# Patient Record
Sex: Female | Born: 1970 | Race: Asian | Hispanic: No | Smoking: Never smoker
Health system: Southern US, Community
[De-identification: ages and names within clinical notes are randomized; demographics above are authoritative.]

## PROBLEM LIST (undated history)

## (undated) DIAGNOSIS — D219 Benign neoplasm of connective and other soft tissue, unspecified: Secondary | ICD-10-CM

## (undated) DIAGNOSIS — E611 Iron deficiency: Secondary | ICD-10-CM

## (undated) DIAGNOSIS — R51 Headache: Secondary | ICD-10-CM

## (undated) DIAGNOSIS — E079 Disorder of thyroid, unspecified: Secondary | ICD-10-CM

## (undated) DIAGNOSIS — K8020 Calculus of gallbladder without cholecystitis without obstruction: Secondary | ICD-10-CM

## (undated) DIAGNOSIS — K219 Gastro-esophageal reflux disease without esophagitis: Secondary | ICD-10-CM

## (undated) DIAGNOSIS — N12 Tubulo-interstitial nephritis, not specified as acute or chronic: Secondary | ICD-10-CM

## (undated) HISTORY — DX: Calculus of gallbladder without cholecystitis without obstruction: K80.20

## (undated) HISTORY — DX: Disorder of thyroid, unspecified: E07.9

---

## 1998-10-27 ENCOUNTER — Ambulatory Visit (HOSPITAL_COMMUNITY): Admission: RE | Admit: 1998-10-27 | Discharge: 1998-10-27 | Payer: Self-pay | Admitting: Gynecology

## 1998-10-27 ENCOUNTER — Encounter (INDEPENDENT_AMBULATORY_CARE_PROVIDER_SITE_OTHER): Payer: Self-pay | Admitting: Specialist

## 1999-02-06 ENCOUNTER — Other Ambulatory Visit: Admission: RE | Admit: 1999-02-06 | Discharge: 1999-02-06 | Payer: Self-pay | Admitting: Obstetrics and Gynecology

## 1999-06-03 ENCOUNTER — Inpatient Hospital Stay (HOSPITAL_COMMUNITY): Admission: AD | Admit: 1999-06-03 | Discharge: 1999-06-03 | Payer: Self-pay | Admitting: Gynecology

## 1999-06-29 ENCOUNTER — Encounter: Admission: RE | Admit: 1999-06-29 | Discharge: 1999-09-27 | Payer: Self-pay | Admitting: Gynecology

## 1999-08-06 ENCOUNTER — Inpatient Hospital Stay (HOSPITAL_COMMUNITY): Admission: AD | Admit: 1999-08-06 | Discharge: 1999-08-10 | Payer: Self-pay | Admitting: Gynecology

## 1999-08-06 ENCOUNTER — Encounter (INDEPENDENT_AMBULATORY_CARE_PROVIDER_SITE_OTHER): Payer: Self-pay

## 1999-08-12 ENCOUNTER — Encounter (HOSPITAL_COMMUNITY): Admission: RE | Admit: 1999-08-12 | Discharge: 1999-11-10 | Payer: Self-pay | Admitting: Gynecology

## 1999-09-21 ENCOUNTER — Other Ambulatory Visit: Admission: RE | Admit: 1999-09-21 | Discharge: 1999-09-21 | Payer: Self-pay | Admitting: Gynecology

## 1999-10-04 ENCOUNTER — Encounter: Admission: RE | Admit: 1999-10-04 | Discharge: 1999-10-04 | Payer: Self-pay | Admitting: Hematology and Oncology

## 1999-11-12 ENCOUNTER — Encounter: Admission: RE | Admit: 1999-11-12 | Discharge: 1999-12-06 | Payer: Self-pay | Admitting: Gynecology

## 2001-02-23 ENCOUNTER — Other Ambulatory Visit: Admission: RE | Admit: 2001-02-23 | Discharge: 2001-02-23 | Payer: Self-pay | Admitting: Gynecology

## 2001-08-25 ENCOUNTER — Emergency Department (HOSPITAL_COMMUNITY): Admission: EM | Admit: 2001-08-25 | Discharge: 2001-08-25 | Payer: Self-pay | Admitting: Emergency Medicine

## 2002-07-19 ENCOUNTER — Other Ambulatory Visit: Admission: RE | Admit: 2002-07-19 | Discharge: 2002-07-19 | Payer: Self-pay | Admitting: Obstetrics and Gynecology

## 2003-07-18 ENCOUNTER — Ambulatory Visit (HOSPITAL_COMMUNITY): Admission: RE | Admit: 2003-07-18 | Discharge: 2003-07-18 | Payer: Self-pay | Admitting: Cardiology

## 2004-04-09 ENCOUNTER — Ambulatory Visit: Payer: Self-pay | Admitting: Family Medicine

## 2004-04-09 ENCOUNTER — Other Ambulatory Visit: Admission: RE | Admit: 2004-04-09 | Discharge: 2004-04-09 | Payer: Self-pay | Admitting: Family Medicine

## 2004-04-10 ENCOUNTER — Other Ambulatory Visit: Admission: RE | Admit: 2004-04-10 | Discharge: 2004-04-10 | Payer: Self-pay | Admitting: Family Medicine

## 2004-04-16 ENCOUNTER — Encounter: Admission: RE | Admit: 2004-04-16 | Discharge: 2004-04-16 | Payer: Self-pay | Admitting: Family Medicine

## 2004-04-18 ENCOUNTER — Ambulatory Visit: Payer: Self-pay | Admitting: Family Medicine

## 2004-07-23 ENCOUNTER — Ambulatory Visit: Payer: Self-pay | Admitting: Family Medicine

## 2004-07-30 ENCOUNTER — Ambulatory Visit: Payer: Self-pay

## 2005-04-30 ENCOUNTER — Other Ambulatory Visit: Admission: RE | Admit: 2005-04-30 | Discharge: 2005-04-30 | Payer: Self-pay | Admitting: Internal Medicine

## 2005-04-30 ENCOUNTER — Ambulatory Visit: Payer: Self-pay | Admitting: Internal Medicine

## 2005-04-30 ENCOUNTER — Encounter: Payer: Self-pay | Admitting: Internal Medicine

## 2005-05-13 ENCOUNTER — Encounter: Admission: RE | Admit: 2005-05-13 | Discharge: 2005-05-13 | Payer: Self-pay | Admitting: Internal Medicine

## 2005-05-27 ENCOUNTER — Encounter: Admission: RE | Admit: 2005-05-27 | Discharge: 2005-05-27 | Payer: Self-pay | Admitting: Internal Medicine

## 2005-07-01 ENCOUNTER — Encounter: Admission: RE | Admit: 2005-07-01 | Discharge: 2005-07-01 | Payer: Self-pay | Admitting: Internal Medicine

## 2006-01-27 ENCOUNTER — Ambulatory Visit: Payer: Self-pay | Admitting: Internal Medicine

## 2006-01-27 ENCOUNTER — Encounter: Admission: RE | Admit: 2006-01-27 | Discharge: 2006-01-27 | Payer: Self-pay | Admitting: Internal Medicine

## 2006-01-27 LAB — CONVERTED CEMR LAB
Ferritin: 7.8 ng/mL — ABNORMAL LOW (ref 10.0–291.0)
HDL: 41.3 mg/dL (ref >39.0)
Iron: 52 ug/dL (ref 42–145)
VLDL: 20 mg/dL (ref 0–40)

## 2006-08-21 ENCOUNTER — Encounter: Payer: Self-pay | Admitting: Internal Medicine

## 2006-11-13 ENCOUNTER — Telehealth (INDEPENDENT_AMBULATORY_CARE_PROVIDER_SITE_OTHER): Payer: Self-pay | Admitting: *Deleted

## 2006-12-08 ENCOUNTER — Ambulatory Visit: Payer: Self-pay | Admitting: Internal Medicine

## 2006-12-08 DIAGNOSIS — O99810 Abnormal glucose complicating pregnancy: Secondary | ICD-10-CM | POA: Insufficient documentation

## 2006-12-08 DIAGNOSIS — M542 Cervicalgia: Secondary | ICD-10-CM

## 2006-12-08 DIAGNOSIS — E039 Hypothyroidism, unspecified: Secondary | ICD-10-CM | POA: Insufficient documentation

## 2006-12-15 ENCOUNTER — Ambulatory Visit: Payer: Self-pay | Admitting: Internal Medicine

## 2006-12-23 ENCOUNTER — Ambulatory Visit: Payer: Self-pay | Admitting: Internal Medicine

## 2006-12-23 DIAGNOSIS — R079 Chest pain, unspecified: Secondary | ICD-10-CM

## 2006-12-26 ENCOUNTER — Ambulatory Visit: Payer: Self-pay | Admitting: Cardiovascular Disease

## 2007-01-05 ENCOUNTER — Encounter: Payer: Self-pay | Admitting: Internal Medicine

## 2007-01-12 ENCOUNTER — Encounter: Payer: Self-pay | Admitting: Cardiovascular Disease

## 2007-01-12 ENCOUNTER — Encounter: Payer: Self-pay | Admitting: Internal Medicine

## 2007-01-12 ENCOUNTER — Ambulatory Visit: Payer: Self-pay

## 2007-07-10 ENCOUNTER — Telehealth: Payer: Self-pay | Admitting: Internal Medicine

## 2007-07-13 ENCOUNTER — Ambulatory Visit: Payer: Self-pay | Admitting: Internal Medicine

## 2007-07-13 ENCOUNTER — Encounter: Payer: Self-pay | Admitting: Internal Medicine

## 2007-07-13 DIAGNOSIS — J301 Allergic rhinitis due to pollen: Secondary | ICD-10-CM | POA: Insufficient documentation

## 2007-07-14 ENCOUNTER — Telehealth: Payer: Self-pay | Admitting: Internal Medicine

## 2007-07-21 LAB — CONVERTED CEMR LAB
ALT: 16 units/L (ref 0–35)
AST: 14 units/L (ref 0–37)
Bilirubin, Direct: 0.1 mg/dL (ref 0.0–0.3)
Total Bilirubin: 0.6 mg/dL (ref 0.3–1.2)

## 2009-01-30 ENCOUNTER — Encounter: Payer: Self-pay | Admitting: Internal Medicine

## 2010-04-08 LAB — CONVERTED CEMR LAB
Eosinophils Absolute: 0.1 10*3/uL (ref 0.0–0.6)
Eosinophils Relative: 1.7 % (ref 0.0–5.0)
GFR calc Af Amer: 180 mL/min
GFR calc non Af Amer: 148 mL/min
Glucose, Bld: 104 mg/dL — ABNORMAL HIGH (ref 70–99)
HCT: 34.1 % — ABNORMAL LOW (ref 36.0–46.0)
HDL: 40 mg/dL (ref >39.0)
Hemoglobin: 11.7 g/dL — ABNORMAL LOW (ref 12.0–15.0)
Lymphocytes Relative: 23.3 % (ref 12.0–46.0)
MCV: 85 fL (ref 78.0–100.0)
Monocytes Absolute: 0.4 10*3/uL (ref 0.2–0.7)
Neutro Abs: 4.6 10*3/uL (ref 1.4–7.7)
Neutrophils Relative %: 68.3 % (ref 43.0–77.0)
Platelets: 238 10*3/uL (ref 150–400)
Potassium: 4.4 meq/L (ref 3.5–5.1)
Sodium: 137 meq/L (ref 135–145)
WBC: 6.7 10*3/uL (ref 4.5–10.5)

## 2010-07-24 NOTE — Assessment & Plan Note (Signed)
[facility]                            CARDIOLOGY OFFICE NOTE   KAMICA, FLORANCE                   MRN:          [id number]  DATE:12/26/2006                            DOB:          1971-01-09    Ms. Brummond is a delightful 40 year old patient referred by Dr. Drue Novel for  chest pain.   The patient is extremely concerned because of her family history. She  has siblings and a mother and father who all had premature coronary  disease, as well as strokes. She has hyperlipidemia with an LDL  cholesterol of 136. She is a non-smoker, non-diabetic, and is non-  hypertensive. She has been having chest pain that has been going on for  one to two weeks. It is somewhat atypical. It is non-exertional. It is a  pinching feeling in her chest. It can be sharp at times. It does radiate  up towards her neck. There is occasionally associated dyspnea.   Sometimes the problem is made worse at night when she lays down. She  does not have a firm diagnosis of reflux or GERD.   She has had pains in the past. She describes on two previous occasions  having a workup. One of them was by a [place] doctor here in town. It  was not Dr. Jacinto Halim. I do not have these records. She said that she had a  stress test and it looked fine.   There were no precipitating factors that she can tell in regards to the  new onset of pain in the last couple of weeks. The pain is not  progressive, however it is not going away. She does not have the pain  currently.   When she gets the pain, there is nothing that she can do to make it  better or go away quicker. She works as a Tree surgeon and does not find  that her activities during her job particularly bring it on.   She has no history of arthritides or musculoskeletal problems. She has  not had any recent trauma, fever, or inflammatory illnesses.   Her review of systems is otherwise remarkable for some exertional  dyspnea. This seems  function. There is no chronic obstructive pulmonary  disease or lung disease. She has not had a cough. There is no history of  DVT or PE.   The patient's past medical history is fairly benign. She has had two C-  sections for her children. Her LDL cholesterol is 136. She has not had  any other surgeries. She is hypothyroid on replacement. She did get a  flu vaccine this year. Her past medical history is otherwise negative.   Family history is remarkable for premature coronary disease in mother  and father, as well as siblings. She has a sister who had a heart  problem at the age of 36.   The patient's medications include:  1. Synthroid 175 mcg daily.  2. Bayer aspirin.  3. Fish oil.   The patient denies any allergies.   She is married. She is from [place]. Her husband works at [employer]. 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 She has two children of  ages 65 and 45 that are healthy. She has limited hobbies and is  sedentary. She does not smoke or drink.   The remainder of her extended family is in [place].   Her exam is remarkable for:  GENERAL:  Healthy appearing [place] female in no distress. Affect is  appropriate. She has a ring in her left nostril.  VITAL SIGNS:  Blood pressure 108/72, pulse 72 and regular, weight 166.  She is afebrile. Respiratory rate 14.  HEENT:  Normal carotids. Normal without bruit. There is no thyromegaly.  No lymphadenopathy. No JVP elevation.  LUNGS:  Clear with good diaphragmatic motion and no wheezing.  CARDIAC:  S1, S2 with normal heart sounds. PMI is normal.  BREASTS:  Not done.  ABDOMEN:  Benign. Bowel sounds positive. No tenderness. No AAA. No  hepatosplenomegaly or hepato jugular reflux.  EXTREMITIES:  Femorals are +3 bilaterally without bruit. Posterior  tibialis pulses are +3. No lower extremity edema.  NEUROLOGIC:  Non-focal.  MUSCULAR:  Normal.  SKIN:  Warm and dry.   Baseline EKG normal except for low voltage across the  precordium, likely  secondary to lead placement and breast tissue.   As indicated, lab work was reviewed with an LDL of 136.   IMPRESSION:  1. Atypical chest pain in a young woman with a markedly positive      family history. Recommend stress Myoview to be done next week.  2. Occasional exertional dyspnea, likely secondary to deconditioning.      Check 2D echocardiogram to rule out cardiomyopathy or primary      pulmonary hypertension.  3. LDL cholesterol of 136. Given the patient's age, I suspect that      diet therapy and a nutrition consult will be the best thing for      her, as opposed to life-long statin therapy. Will leave this up to      Dr. Drue Novel to discuss.  4. As long as her stress test and echo are normal, she will be seen on      an as-needed basis.    Noralyn Pick. Eden Emms, MD, [facility]  Electronically Signed   PCN/MedQ  DD: 12/26/2006  DT: 12/27/2006  Job #: [id number]

## 2010-07-27 NOTE — Discharge Summary (Signed)
[facility]  Patient:    MARYETTA, SHAFER                     MRN: [id number] Adm. Date:  [id number] Disc. Date: [id number] Attending:  Douglass Rivers Dictator:   Antony Contras, [facility]                           Discharge Summary  DISCHARGE DIAGNOSES:          1. Intrauterine pregnancy at 40 weeks with                                  breech presentation.                               2. History of gestational diabetes.                               3. History of noncompliance.  PROCEDURE:                    Low transverse cesarean section.  HISTORY OF PRESENT ILLNESS:   The patient is a 40 year old gravida 4, para 1-0-2-1-1 with an LMP of November 10, 1998 and an [facility] of Aug 07, 1999 by ultrasound.  The pregnancy was complicated by hypothyroidism, for which she takes Synthroid, 0.75 mg, also gestational diabetes and the patient was noncompliant concerning the management of the gestational diabetes and not presenting routinely for office visits or following her diet or checking her sugars.  Prenatal labs were as follow.  Blood type A positive, rubella immune, RPR/HBSAG/HIV nonreactive, MSAFP within normal limits.  HOSPITAL COURSE AND TREATMENT:                The patient was admitted for glycemic control and elective cesarean section due to her breech presentation and, also, in hopes to promote better glycemic control in her newborn.  Low cervical transverse cesarean section was performed by Dr. Farrel Gobble, assisted by Dr. Lily Peer under spinal anesthesia.  She delivered a viable female infant with APGAR of 9 and 9, birth weight 3695 g.  Normal uterus, tubes and ovaries.  Her postpartum course was uneventful.  She had no difficulty voiding and no fever. Her FBS was 92 on the day after delivery.  CBC showed hematocrit 27.2, hemoglobin 9.2, white blood cell count 7.7, platelets 148.  She was able to be discharged on her third postoperative day in  satisfactory condition.  DISPOSITION:                  Follow up for six week check.  MEDICATIONS:                  Tylox, 1-2 p.o. q.4-6h. p.r.n. pain.  She was instructed to check her fasting blood sugars and to bring a record of this to her postpartum visit. DD:  08/27/99 TD:  08/28/99 Job: [id number]

## 2010-07-27 NOTE — H&P (Signed)
[facility]  Patient:    Whitney Chambers, Whitney Chambers                     MRN: [id number] Adm. Date:  [id number] Attending:  Douglass Rivers                         History and Physical  CHIEF COMPLAINT:              1. Gestational diabetic, type unknown.                               2. Noncompliance.                               3. Intrauterine pregnancy at 40 weeks breech.  HISTORY OF PRESENT ILLNESS:   The patient is a 40 year old gravida 4, para 1-0-2-1, with an LMP of November 10, 1998, estimated date of confinement of Aug 07, 1999.  This pregnancy was complicated by hypothyroidism on Synthroid and gestational diabetes, as well as noncompliance.  The patient was noted to have an elevated one-hour Glucola of 158 at approximately 26 weeks and was scheduled for a three-hour GTT.  The patient failed to keep that appointment on several visits to the office and also failed to keep return OB visits.  She finally had her three-hour Glucola at close to 34 weeks which made her gestational diabetic.  She was then lost to followup for four weeks. When she presented back to the office at 38+ weeks, she had not been doing finger sticks, nor had she been following the diabetic diet.  It was stressed to her the importance of compliance with regard to fetal size, fetal postpartum glycemic control, as well as electrolyte balance.  The patient was asked to come back to the office over the weekend.  When she presented back three days later, she had only done one or two finger sticks, both of which were elevated.  Again it was stressed the importance that she comply with the diet.  Diet was discussed with her.  The patient appeared to understand and followed back up several days later with finger sticks four x a day, fastings, and two hour postprandials.  Despite what she felt was compliant with the diet, all of her finger sticks were elevated.  We tried again to instill  the diabetic diet.  She was unable to meet the nutritionist because of other obligations and again presented back to the office at 39+ weeks, again with all of her finger sticks erratically elevated.  She had gotten NST and AFIs because of the diabetes which is how we discovered that she was a frank breech presentation.  Initially AFI had been low, but on the last scan had been normal.  The patient therefore presented electively today for improved glycemic control.  PRENATAL LABORATORY DATA:     She is A positive, antibody negative, RPR nonreactive, rubella immune.  Hepatitis B surface antigen nonreactive.  HIV nonreactive.  GBS negative.  PAST OB/GYN HISTORY:          Regular menses, 4-7 day flow.  Normal Pap smears.  An NSVD at full term in 1997; in 1999, a six-week therapeutic abortion.  In September 2000, [facility] for a missed AB for a blighted ovum.  PAST MEDICAL HISTORY:  Significant only for hypothyroidism which was diagnosed in her last pregnancy.  PAST SURGICAL HISTORY:        Dilatation and curettage in 1999 and 2000.  MEDICATIONS:                  1. Synthroid 0.05.                               2. Prenatal vitamins.                               3. Iron.  ALLERGIES:                    Negative.  FAMILY HISTORY:               Negative.  PHYSICAL EXAMINATION  VITAL SIGNS:                  Temperature 97.1, blood pressure 98/54, heart rate 88, respiratory rate 20.  GENERAL:                      No acute distress.  HEENT:                        Unremarkable.  NECK:                         Thyroid has been tender and mobile.  CARDIAC:                      Heart is regular rate.  LUNGS:                        Clear to auscultation.  ABDOMEN:                      Gravid, soft, nontender.  PELVIC:                       Bimanual exam was deferred.  EXTREMITIES:                  No edema.  Normal reflex.  No clonus.                                NST is  reactive.  No contractions.  ASSESSMENT:                   Forty week gestational diabetic, noncompliance with appointments and diet.  The patient was admitted this afternoon and is planned for a primary cesarean section in the morning for glycemic control in order to improve the neonatal period of her newborn.  She is aware that noncompliance with a diabetic diet can increase risks for hypoglycemic episodes in the newborn as well as electrolyte abnormalities.  Since being admitted to the hospital and following the diabetic diet, post lunch CBG was 128, her post dinner CBG was 100.  She will get an ultrasound in the morning and if the baby is confirmed still breech, we will proceed with a primary cesarean section.  The risks and benefits of cesarean section were discussed with the patient and accepted.  All questions were addressed  and she will be NPO after midnight. DD:  08/06/99 TD:  08/06/99 Job: [id number]

## 2010-07-27 NOTE — Op Note (Signed)
[facility]  Patient:    Whitney Chambers, Whitney Chambers                     MRN: [id number] Proc. Date: 08/07/99 Adm. Date:  [id number] Attending:  Douglass Rivers                           Operative Report  PREOPERATIVE DIAGNOSIS:       Homero Fellers breech presentation, gestational diabetes, 40-week intrauterine pregnancy.  POSTOPERATIVE DIAGNOSIS:      Homero Fellers breech presentation, gestational diabetes, 40-week intrauterine pregnancy.  OPERATION:                    Primary cesarean section low flap transverse.  SURGEON:                      Douglass Rivers, M.D.  ASSISTANT:                    Gaetano Hawthorne. Lily Peer, M.D.  ANESTHESIA:                   Spinal.  ESTIMATED BLOOD LOSS:         500 cc.  FINDINGS:                     A viable female infant in the sacrum posterior position, frank breech.  Clear amniotic fluid, Apgars 9 and 9, birth weight 3695 grams.  Normal uterus, tubes, and ovaries.  COMPLICATIONS:                None.  PATHOLOGY:                    Placenta.  DESCRIPTION OF PROCEDURE:     The patient was taken to the operating room and spinal anesthesia was induced and placed in the supine position with left lateral displacement, prepped and draped in the usual sterile fashion.  After adequate anesthesia was inserted, Pfannenstiel skin incision was made with the scalpel and carried through to the underlying layer of the fascia with electrocautery.  The  fascia was scored in the midline and the incision was extended laterally with Mayo scissors.  The inferior aspect of the fascial incision was grasped with Kochers. The underlying rectus muscles were dissected off by blunt and sharp dissection. In a similar fashion, the superior aspect of the incision was grasped with Kochers. The underlying rectus muscles were separated off.  The rectus muscles were separated.  The peritoneum was identified, tented up, and entered by blunt dissection.  The  peritoneal incision was then extended superiorly and inferiorly with good visualization of the underlying bowel and bladder.  The orientation of the uterus was confirmed.  The bladder blade was inserted.  The vesicouterine peritoneum was identified, tented up, entered sharply with the Metzenbaum scissors. The incision was extended laterally.  The bladder flap was created digitally. he bladder blade was then reinserted and the lower uterine segment was incised in  transverse fashion with the scalpel.  Clear amniotic fluid was noted upon entering the cavity.  The infant was delivered from the frank breech presentation with the usual maneuvers.  A loose nuchal cord was noted.  The cord was cut and clamped nd the infant was handed off to the awaiting pediatricians.  Cord bloods were obtained. The uterus was massaged.  The placenta was allowed to separate naturally.  The uterus was then cleared of all clots and debris.  The uterine incision was repaired with a running layer of 0 chromic.  A small area of bleeding inferior o the incision was treated with a single figure-of-eight suture.  The pelvis was irrigated with copious amounts of warm saline.  The incision was reinspected and hemostatic as were the peritoneum and muscle.  The fascia was then closed with  Vicryl starting at the apex and moving toward the midline bilaterally.  The subcu was irrigated and the skin was closed with staples.  The patient tolerated the procedure well.  Sponge, needle, and instrument counts were correct x 2.  She was given Ancef intraoperatively and transferred to the PACU in stable condition. DD:  08/07/99 TD:  08/07/99 Job: [id number]

## 2011-01-08 ENCOUNTER — Other Ambulatory Visit: Payer: Self-pay | Admitting: Family Medicine

## 2011-01-08 DIAGNOSIS — IMO0002 Reserved for concepts with insufficient information to code with codable children: Secondary | ICD-10-CM

## 2011-01-08 DIAGNOSIS — R1013 Epigastric pain: Secondary | ICD-10-CM

## 2011-01-14 ENCOUNTER — Ambulatory Visit
Admission: RE | Admit: 2011-01-14 | Discharge: 2011-01-14 | Disposition: A | Discharge status: Home / Self Care | Payer: BC Managed Care – PPO | Source: Ambulatory Visit | Attending: Family Medicine | Admitting: Family Medicine

## 2011-01-14 DIAGNOSIS — IMO0002 Reserved for concepts with insufficient information to code with codable children: Secondary | ICD-10-CM

## 2011-01-14 DIAGNOSIS — R1013 Epigastric pain: Secondary | ICD-10-CM

## 2011-01-29 ENCOUNTER — Ambulatory Visit (INDEPENDENT_AMBULATORY_CARE_PROVIDER_SITE_OTHER): Payer: BC Managed Care – PPO | Admitting: Surgery

## 2011-02-11 ENCOUNTER — Encounter (INDEPENDENT_AMBULATORY_CARE_PROVIDER_SITE_OTHER): Payer: Self-pay | Admitting: Surgery

## 2011-02-11 ENCOUNTER — Ambulatory Visit (INDEPENDENT_AMBULATORY_CARE_PROVIDER_SITE_OTHER): Payer: BC Managed Care – PPO | Admitting: Surgery

## 2011-02-11 DIAGNOSIS — K8020 Calculus of gallbladder without cholecystitis without obstruction: Secondary | ICD-10-CM

## 2011-02-11 NOTE — Progress Notes (Signed)
Patient ID: Whitney Chambers, female   DOB: 19-May-1970, 40 y.o.   MRN: [id number]  Chief Complaint  Patient presents with  . Other    new pt- eval GB with stones    HPI Whitney Chambers is a 40 y.o. female.    HPI Patient presents at the request of Dr. Drue Novel do 2 epigastric abdominal pain. She has had one attack of mild epigastric abdominal pain. It lasted 2 hours. It went away on its. He was not related to any meal.  No associated nausea or vomiting. No fatty food intolerance. Ultrasound was ordered of her abdomen showed gallstones without signs of inflammation or common duct stone.  Past Medical History  Diagnosis Date  . Gallstones   . Thyroid disease     hypothyroidism    History reviewed. No pertinent past surgical history.  History reviewed. No pertinent family history.  Social History History  Substance Use Topics  . Smoking status: Never Smoker   . Smokeless tobacco: Not on file  . Alcohol Use: Yes    No Known Allergies  Current Outpatient Prescriptions  Medication Sig Dispense Refill  . aspirin 81 MG tablet Take 81 mg by mouth daily.        . fish oil-omega-3 fatty acids 1000 MG capsule Take 2 g by mouth daily.        Marland Kitchen levothyroxine (SYNTHROID, LEVOTHROID) 150 MCG tablet Take 150 mcg by mouth daily.        Marland Kitchen VITAMIN E PO Take by mouth daily.          Review of Systems Review of Systems  Constitutional: Negative.   HENT: Negative.   Eyes: Negative.   Respiratory: Negative.   Cardiovascular: Negative.   Gastrointestinal: Negative.   Genitourinary: Negative.   Musculoskeletal: Negative.   Neurological: Negative.   Hematological: Negative.   Psychiatric/Behavioral: Negative.     Blood pressure 118/82, pulse 72, temperature 98.8 F (37.1 C), temperature source Temporal, resp. rate 16, height 5\' 3"  (1.6 m), weight 155 lb 3.2 oz (70.398 kg).  Physical Exam Physical Exam  Constitutional: She is oriented to person, place, and time. She appears  well-developed and well-nourished.  HENT:  Head: Normocephalic and atraumatic.  Eyes: EOM are normal. Pupils are equal, round, and reactive to light.  Neck: Normal range of motion. Neck supple.  Cardiovascular: Normal rate and regular rhythm.   Pulmonary/Chest: Effort normal and breath sounds normal.  Abdominal: Soft. Bowel sounds are normal.  Musculoskeletal: Normal range of motion.  Neurological: She is alert and oriented to person, place, and time.  Skin: Skin is warm and dry.  Psychiatric: She has a normal mood and affect. Her behavior is normal. Judgment and thought content normal.    Data Reviewed U/S GALLSTONES WITHOUT INFLAMMATION.  Assessment    CHOLELITHIASIS.    Plan    She is having minimal symptoms currently. I discussed surgical treatment of this condition with her. I discussed potential complications surgery as well. She has no interest in undergoing cholecystectomy at this point time since she is having few symptoms. I explained that the attacks could worsen without surgical intervention and could lead to more complicated outcome. She will contact me if her symptoms worsen but does not wish to pursue surgical treatment. We discussed dietary factors that may precipitate an attack. She will followup as needed.       Netanel Yannuzzi A. 02/11/2011, 12:28 PM

## 2011-02-11 NOTE — Patient Instructions (Signed)
Cholelithiasis Cholelithiasis (also called gallstones) is a form of gallbladder disease where gallstones form in your gallbladder. The gallbladder is a non-essential organ that stores bile made in the liver, which helps digest fats. Gallstones begin as small crystals and slowly grow into stones. Gallstone pain occurs when the gallbladder spasms, and a gallstone is blocking the duct. Pain can also occur when a stone passes out of the duct.  Women are more likely to develop gallstones than men. Other factors that increase the risk of gallbladder disease are:  Having multiple pregnancies. Physicians sometimes advise removing diseased gallbladders before future pregnancies.   Obesity.   Diets heavy in fried foods and fat.   Increasing age (older than 60).   Prolonged use of medications containing female hormones.   Diabetes mellitus.   Rapid weight loss.   Family history of gallstones (heredity).  SYMPTOMS  Feeling sick to your stomach (nauseous).   Abdominal pain.   Yellowing of the skin (jaundice).   Sudden pain. It may persist from several minutes to several hours.   Worsening pain with deep breathing or when jarred.   Fever.   Tenderness to the touch.  In some cases, when gallstones do not move into the bile duct, people have no pain or symptoms. These are called "silent" gallstones. TREATMENT In severe cases, emergency surgery may be required. HOME CARE INSTRUCTIONS   Only take over-the-counter or prescription medicines for pain, discomfort, or fever as directed by your caregiver.   Follow a low-fat diet until seen again. Fat causes the gallbladder to contract, which can result in pain.   Follow up as instructed. Attacks are almost always recurrent and surgery is usually required for permanent treatment.  SEEK IMMEDIATE MEDICAL CARE IF:   Your pain increases and is not controlled by medications.   You have an oral temperature above 102 F (38.9 C), not controlled by  medication.   You develop nausea and vomiting.  MAKE SURE YOU:   Understand these instructions.   Will watch your condition.   Will get help right away if you are not doing well or get worse.  Document Released: 02/21/2005 Document Revised: 11/07/2010 Document Reviewed: 04/26/2010 [employer] Patient Information 2012 [employer]. 

## 2012-05-18 ENCOUNTER — Other Ambulatory Visit (HOSPITAL_COMMUNITY)
Admission: RE | Admit: 2012-05-18 | Discharge: 2012-05-18 | Disposition: A | Discharge status: Home / Self Care | Payer: BC Managed Care – PPO | Source: Ambulatory Visit | Attending: Obstetrics and Gynecology | Admitting: Obstetrics and Gynecology

## 2012-05-18 ENCOUNTER — Other Ambulatory Visit: Payer: Self-pay | Admitting: Obstetrics and Gynecology

## 2012-05-18 DIAGNOSIS — Z01419 Encounter for gynecological examination (general) (routine) without abnormal findings: Secondary | ICD-10-CM | POA: Insufficient documentation

## 2012-05-18 DIAGNOSIS — Z1151 Encounter for screening for human papillomavirus (HPV): Secondary | ICD-10-CM | POA: Insufficient documentation

## 2012-05-25 ENCOUNTER — Other Ambulatory Visit: Payer: Self-pay | Admitting: Obstetrics and Gynecology

## 2012-09-01 ENCOUNTER — Emergency Department (HOSPITAL_BASED_OUTPATIENT_CLINIC_OR_DEPARTMENT_OTHER): Payer: BC Managed Care – PPO

## 2012-09-01 ENCOUNTER — Emergency Department (HOSPITAL_BASED_OUTPATIENT_CLINIC_OR_DEPARTMENT_OTHER)
Admission: EM | Admit: 2012-09-01 | Discharge: 2012-09-01 | Disposition: A | Discharge status: Home / Self Care | Payer: BC Managed Care – PPO | Source: Home / Self Care | Attending: Emergency Medicine | Admitting: Emergency Medicine

## 2012-09-01 ENCOUNTER — Encounter (HOSPITAL_BASED_OUTPATIENT_CLINIC_OR_DEPARTMENT_OTHER): Payer: Self-pay | Admitting: *Deleted

## 2012-09-01 DIAGNOSIS — Z3202 Encounter for pregnancy test, result negative: Secondary | ICD-10-CM | POA: Insufficient documentation

## 2012-09-01 DIAGNOSIS — Z8719 Personal history of other diseases of the digestive system: Secondary | ICD-10-CM | POA: Insufficient documentation

## 2012-09-01 DIAGNOSIS — E039 Hypothyroidism, unspecified: Secondary | ICD-10-CM | POA: Insufficient documentation

## 2012-09-01 DIAGNOSIS — Z8742 Personal history of other diseases of the female genital tract: Secondary | ICD-10-CM | POA: Insufficient documentation

## 2012-09-01 DIAGNOSIS — N12 Tubulo-interstitial nephritis, not specified as acute or chronic: Principal | ICD-10-CM | POA: Diagnosis present

## 2012-09-01 DIAGNOSIS — Z79899 Other long term (current) drug therapy: Secondary | ICD-10-CM | POA: Insufficient documentation

## 2012-09-01 DIAGNOSIS — R110 Nausea: Secondary | ICD-10-CM | POA: Insufficient documentation

## 2012-09-01 DIAGNOSIS — N390 Urinary tract infection, site not specified: Secondary | ICD-10-CM | POA: Insufficient documentation

## 2012-09-01 DIAGNOSIS — Z7982 Long term (current) use of aspirin: Secondary | ICD-10-CM | POA: Insufficient documentation

## 2012-09-01 DIAGNOSIS — A498 Other bacterial infections of unspecified site: Secondary | ICD-10-CM | POA: Diagnosis present

## 2012-09-01 HISTORY — DX: Benign neoplasm of connective and other soft tissue, unspecified: D21.9

## 2012-09-01 LAB — LIPASE, BLOOD: Lipase: 28 U/L (ref 11–59)

## 2012-09-01 LAB — COMPREHENSIVE METABOLIC PANEL
ALT: 35 U/L (ref 0–35)
Albumin: 3.3 g/dL — ABNORMAL LOW (ref 3.5–5.2)
BUN: 10 mg/dL (ref 6–23)
Calcium: 9.4 mg/dL (ref 8.4–10.5)
GFR calc Af Amer: >90 mL/min (ref >90)
Glucose, Bld: 104 mg/dL — ABNORMAL HIGH (ref 70–99)
Sodium: 137 mEq/L (ref 135–145)
Total Protein: 7.2 g/dL (ref 6.0–8.3)

## 2012-09-01 LAB — CBC WITH DIFFERENTIAL/PLATELET
Basophils Relative: 0 % (ref 0–1)
Eosinophils Absolute: 0 10*3/uL (ref 0.0–0.7)
Eosinophils Relative: 0 % (ref 0–5)
Lymphs Abs: 0.7 10*3/uL (ref 0.7–4.0)
MCH: 30.2 pg (ref 26.0–34.0)
MCHC: 34.2 g/dL (ref 30.0–36.0)
MCV: 88.3 fL (ref 78.0–100.0)
Neutrophils Relative %: 73 % (ref 43–77)
Platelets: 168 10*3/uL (ref 150–400)
RBC: 3.41 MIL/uL — ABNORMAL LOW (ref 3.87–5.11)

## 2012-09-01 LAB — URINALYSIS, ROUTINE W REFLEX MICROSCOPIC
Nitrite: NEGATIVE
Specific Gravity, Urine: 1.012 (ref 1.005–1.030)
Urobilinogen, UA: 1 mg/dL (ref 0.0–1.0)
pH: 6 (ref 5.0–8.0)

## 2012-09-01 LAB — PREGNANCY, URINE: Preg Test, Ur: NEGATIVE

## 2012-09-01 LAB — URINE MICROSCOPIC-ADD ON

## 2012-09-01 MED ORDER — ONDANSETRON HCL 4 MG/2ML IJ SOLN
4.0000 mg | Freq: Once | INTRAMUSCULAR | Status: AC
  Administered 2012-09-01: 4 mg via INTRAVENOUS
  Filled 2012-09-01: qty 2

## 2012-09-01 MED ORDER — HYDROMORPHONE HCL PF 1 MG/ML IJ SOLN
0.5000 mg | Freq: Once | INTRAMUSCULAR | Status: AC
  Administered 2012-09-01: 0.5 mg via INTRAVENOUS
  Filled 2012-09-01: qty 1

## 2012-09-01 MED ORDER — IOHEXOL 300 MG/ML  SOLN
50.0000 mL | Freq: Once | INTRAMUSCULAR | Status: AC | PRN
  Administered 2012-09-01: 50 mL via ORAL

## 2012-09-01 MED ORDER — IOHEXOL 300 MG/ML  SOLN
100.0000 mL | Freq: Once | INTRAMUSCULAR | Status: AC | PRN
  Administered 2012-09-01: 100 mL via INTRAVENOUS

## 2012-09-01 MED ORDER — HYDROCODONE-ACETAMINOPHEN 5-325 MG PO TABS: 2.0000 | ORAL_TABLET | ORAL | Status: DC | PRN

## 2012-09-01 NOTE — ED Notes (Signed)
Seen at [facility] physicians yesterday and today for fever and abd pain- has paperwork with her- reports she is here to eval gallbladder

## 2012-09-01 NOTE — ED Provider Notes (Signed)
History    CSN: [id number] Arrival date & time 09/01/12  1246  First MD Initiated Contact with Patient 09/01/12 1309     Chief Complaint  Patient presents with  . Abdominal Pain   (Consider location/radiation/quality/duration/timing/severity/associated sxs/prior Treatment) Patient is a 42 y.o. female presenting with abdominal pain. The history is provided by the patient. No language interpreter was used.  Abdominal Pain This is a new problem. The current episode started yesterday. The problem occurs constantly. The problem has been gradually worsening. Associated symptoms include abdominal pain and nausea. Nothing aggravates the symptoms. She has tried acetaminophen for the symptoms. The treatment provided mild relief.  Pt was seen and diagnosed with a uti yesterday.   Pt was treated with rocephin and is on cipro.   Pt was rechecked in office today and sent here for ct scan evaluation of kidney possible pyelonephritis.   Pt also has a history of gallstones.   Pt saw surgeon in March.   Past Medical History  Diagnosis Date  . Gallstones   . Thyroid disease     hypothyroidism  . Fibroids    Past Surgical History  Procedure Laterality Date  . Cesarean section     No family history on file. History  Substance Use Topics  . Smoking status: Never Smoker   . Smokeless tobacco: Not on file  . Alcohol Use: Yes     Comment: occasional   OB History   Grav Para Term Preterm Abortions TAB SAB Ect Mult Living                 Review of Systems  Gastrointestinal: Positive for nausea and abdominal pain.  All other systems reviewed and are negative.    Allergies  Review of patient's allergies indicates no known allergies.  Home Medications   Current Outpatient Rx  Name  Route  Sig  Dispense  Refill  . acetaminophen (TYLENOL) 100 MG/ML solution   Oral   Take 10 mg/kg by mouth every 4 (four) hours as needed for fever.         . fish oil-omega-3 fatty acids 1000 MG capsule    Oral   Take 2 g by mouth daily.           Marland Kitchen levothyroxine (SYNTHROID, LEVOTHROID) 137 MCG tablet   Oral   Take 137 mcg by mouth daily before breakfast.         . Multiple Vitamin (MULTIVITAMIN) capsule   Oral   Take 1 capsule by mouth daily.         Marland Kitchen VITAMIN E PO   Oral   Take by mouth daily.           Marland Kitchen aspirin 81 MG tablet   Oral   Take 81 mg by mouth daily.            BP 111/69  Pulse 78  Temp(Src) 98.9 F (37.2 C) (Oral)  Resp 20  Ht 5' 2.75" (1.594 m)  Wt 150 lb (68.04 kg)  BMI 26.78 kg/m2  SpO2 98%  LMP 08/11/2012 Physical Exam  Nursing note and vitals reviewed. Constitutional: She is oriented to person, place, and time. She appears well-developed and well-nourished.  HENT:  Head: Normocephalic and atraumatic.  Eyes: Conjunctivae and EOM are normal. Pupils are equal, round, and reactive to light.  Neck: Normal range of motion.  Cardiovascular: Normal rate.   Pulmonary/Chest: Effort normal and breath sounds normal.  Abdominal: Soft. Bowel sounds are normal. She exhibits  no distension.  Musculoskeletal: Normal range of motion.  Neurological: She is alert and oriented to person, place, and time.  Skin: Skin is warm.  Psychiatric: She has a normal mood and affect.   Results for orders placed during the hospital encounter of 09/01/12  CBC WITH DIFFERENTIAL      Result Value Range   WBC 5.0  4.0 - 10.5 K/uL   RBC 3.41 (*) 3.87 - 5.11 MIL/uL   Hemoglobin 10.3 (*) 12.0 - 15.0 g/dL   HCT 40.9 (*) 81.1 - 91.4 %   MCV 88.3  78.0 - 100.0 fL   MCH 30.2  26.0 - 34.0 pg   MCHC 34.2  30.0 - 36.0 g/dL   RDW 78.2  95.6 - 21.3 %   Platelets 168  150 - 400 K/uL   Neutrophils Relative % 73  43 - 77 %   Neutro Abs 3.7  1.7 - 7.7 K/uL   Lymphocytes Relative 14  12 - 46 %   Lymphs Abs 0.7  0.7 - 4.0 K/uL   Monocytes Relative 13 (*) 3 - 12 %   Monocytes Absolute 0.7  0.1 - 1.0 K/uL   Eosinophils Relative 0  0 - 5 %   Eosinophils Absolute 0.0  0.0 - 0.7 K/uL    Basophils Relative 0  0 - 1 %   Basophils Absolute 0.0  0.0 - 0.1 K/uL  COMPREHENSIVE METABOLIC PANEL      Result Value Range   Sodium 137  135 - 145 mEq/L   Potassium 4.1  3.5 - 5.1 mEq/L   Chloride 103  96 - 112 mEq/L   CO2 25  19 - 32 mEq/L   Glucose, Bld 104 (*) 70 - 99 mg/dL   BUN 10  6 - 23 mg/dL   Creatinine, Ser 0.86  0.50 - 1.10 mg/dL   Calcium 9.4  8.4 - 57.8 mg/dL   Total Protein 7.2  6.0 - 8.3 g/dL   Albumin 3.3 (*) 3.5 - 5.2 g/dL   AST 34  0 - 37 U/L   ALT 35  0 - 35 U/L   Alkaline Phosphatase 70  39 - 117 U/L   Total Bilirubin 0.8  0.3 - 1.2 mg/dL   GFR calc non Af Amer >90  >90 mL/min   GFR calc Af Amer >90  >90 mL/min  LIPASE, BLOOD      Result Value Range   Lipase 28  11 - 59 U/L  URINALYSIS, ROUTINE W REFLEX MICROSCOPIC      Result Value Range   Color, Urine AMBER (*) YELLOW   APPearance CLEAR  CLEAR   Specific Gravity, Urine 1.012  1.005 - 1.030   pH 6.0  5.0 - 8.0   Glucose, UA NEGATIVE  NEGATIVE mg/dL   Hgb urine dipstick MODERATE (*) NEGATIVE   Bilirubin Urine NEGATIVE  NEGATIVE   Ketones, ur NEGATIVE  NEGATIVE mg/dL   Protein, ur NEGATIVE  NEGATIVE mg/dL   Urobilinogen, UA 1.0  0.0 - 1.0 mg/dL   Nitrite NEGATIVE  NEGATIVE   Leukocytes, UA TRACE (*) NEGATIVE  PREGNANCY, URINE      Result Value Range   Preg Test, Ur NEGATIVE  NEGATIVE  URINE MICROSCOPIC-ADD ON      Result Value Range   Squamous Epithelial / LPF RARE  RARE   WBC, UA 3-6  <3 WBC/hpf   RBC / HPF 7-10  <3 RBC/hpf   Bacteria, UA FEW (*) RARE   Urine-Other MUCOUS  PRESENT     US Abdomen Complete  09/01/2012   *RADIOLOGY REPORT*  Clinical Data:  Epigastric abdominal pain.  Nausea.  Fever. Current history of gallstones.  COMPLETE ABDOMINAL ULTRASOUND  Comparison:  Abdominal ultrasound 01/14/2011.  Findings:  Gallbladder:  Numerous shadowing calculi the largest on the order of 6 mm in size.  No gallbladder wall thickening or pericholecystic fluid.  Negative sonographic Murphy's sign  according to the ultrasound technologist.  Common bile duct:  Normal in caliber with maximum diameter approximating 2 mm.  Liver:  No focal mass lesion seen.  Within normal limits in parenchymal echogenicity.  IVC:  Patent.  Pancreas:  Although the pancreas is difficult to visualize in its entirety, no focal pancreatic abnormality is identified.  Spleen:  Normal size and echotexture without focal parenchymal abnormality.  Right Kidney:  No hydronephrosis.  Well-preserved cortex.  No shadowing calculi.  Normal size and parenchymal echotexture without focal abnormalities.  Approximately 11.2 cm in length.  Left Kidney:  No hydronephrosis.  Small extrarenal pelvis.  Well- preserved cortex.  No shadowing calculi.  Normal size and parenchymal echotexture without focal abnormalities.  Approximately 10.9 cm in length.  Abdominal aorta:  Normal in caliber throughout its visualized course in the abdomen without significant atherosclerosis.  IMPRESSION:  1.  Cholelithiasis without sonographic evidence of acute cholecystitis. 2.  Otherwise normal examination.   Original Report Authenticated By: Hulan Saas, M.D.   Ct Abdomen Pelvis W Contrast  09/01/2012   *RADIOLOGY REPORT*  Clinical Data: Epigastric abdominal pain.  Nausea.  Gallstones.  CT ABDOMEN AND PELVIS WITH CONTRAST  Technique:  Multidetector CT imaging of the abdomen and pelvis was performed following the standard protocol during bolus administration of intravenous contrast.  Contrast: 50mL OMNIPAQUE IOHEXOL 300 MG/ML  SOLN, OMNIPAQUE IOHEXOL 300 MG/ML  SOLN  Comparison: 09/01/2012.  Findings: Trace bilateral pleural effusions are present.  Small pericardial effusion inferiorly, best appreciated on the parasagittal images. The liver, spleen, pancreas, and adrenal glands appear unremarkable.  The gallstones seen on ultrasound are not readily apparent on today's CT.  No biliary dilatation noted.  No pathologic retroperitoneal or porta hepatis adenopathy is  identified.  Appendix normal.  Borderline prominence of gas and stool in the colon, particularly proximally.  Suspected 2.6 x 1.6 x 1.8 cm fibroid in the posterior uterine body.  2.5 cm hypodensity associated with the left ovary, likely a cyst.  No free pelvic fluid observed.  Patchy hypoenhancement in the left kidney upper pole raises suspicion for inflammation, and is best appreciated on the delayed images.  IMPRESSION:  1.  Hypoenhancement in the left kidney upper pole, especially appreciable on delayed images, concerning for possible early pyelonephritis.  No specific renal arterial abnormality is observed to suggest predisposition for renal infarct. 2.  Trace bilateral pleural effusions and small pericardial effusion, etiology uncertain. 3.  Suspected small posterior uterine body fibroid.   Original Report Authenticated By: Gaylyn Rong, M.D.    ED Course  Procedures (including critical care time) Labs Reviewed - No data to display No results found. 1. UTI (lower urinary tract infection)     MDM  Ultrasound shows gallstone  No acute cholecystitis.   No elevation in lfts or wbc.    Urine shows moderate blood and 3-6 wbc's.    Ct scan trace effusions bilat,   Possible early pyelo.    I counseled pt on infection.   I advised continue cipro.   Pt advised to recheck with surgeon regarding kidney  stones.    Lonia Skinner [place], PA-C 09/01/12 2154

## 2012-09-02 ENCOUNTER — Inpatient Hospital Stay (HOSPITAL_COMMUNITY)
Admission: AD | Admit: 2012-09-02 | Discharge: 2012-09-03 | DRG: 321 | Disposition: A | Discharge status: Home / Self Care | Payer: BC Managed Care – PPO | Source: Ambulatory Visit | Attending: Internal Medicine | Admitting: Internal Medicine

## 2012-09-02 ENCOUNTER — Telehealth: Payer: Self-pay | Admitting: Internal Medicine

## 2012-09-02 DIAGNOSIS — N390 Urinary tract infection, site not specified: Secondary | ICD-10-CM

## 2012-09-02 DIAGNOSIS — Z1619 Resistance to other specified beta lactam antibiotics: Secondary | ICD-10-CM

## 2012-09-02 DIAGNOSIS — N12 Tubulo-interstitial nephritis, not specified as acute or chronic: Secondary | ICD-10-CM

## 2012-09-02 DIAGNOSIS — A498 Other bacterial infections of unspecified site: Secondary | ICD-10-CM | POA: Diagnosis present

## 2012-09-02 HISTORY — DX: Tubulo-interstitial nephritis, not specified as acute or chronic: N12

## 2012-09-02 LAB — URINE CULTURE
Colony Count: NO GROWTH
Culture: NO GROWTH

## 2012-09-02 MED ORDER — SODIUM CHLORIDE 0.9 % IV SOLN
INTRAVENOUS | Status: DC
  Administered 2012-09-02: 23:00:00 via INTRAVENOUS

## 2012-09-02 MED ORDER — HYDROCODONE-ACETAMINOPHEN 5-325 MG PO TABS
2.0000 | ORAL_TABLET | ORAL | Status: DC | PRN
  Administered 2012-09-03 (×2): 2 via ORAL
  Filled 2012-09-02 (×3): qty 2

## 2012-09-02 MED ORDER — DEXTROSE 5 % IV SOLN
1.0000 g | INTRAVENOUS | Status: DC
  Filled 2012-09-02: qty 10

## 2012-09-02 MED ORDER — PROMETHAZINE HCL 25 MG PO TABS: 12.5000 mg | ORAL_TABLET | Freq: Three times a day (TID) | ORAL | Status: DC | PRN

## 2012-09-02 MED ORDER — LEVOTHYROXINE SODIUM 137 MCG PO TABS
137.0000 ug | ORAL_TABLET | Freq: Every day | ORAL | Status: DC
  Administered 2012-09-03: 137 ug via ORAL
  Filled 2012-09-02 (×2): qty 1

## 2012-09-02 MED ORDER — SODIUM CHLORIDE 0.9 % IV SOLN
1.0000 g | INTRAVENOUS | Status: DC
  Administered 2012-09-02: 1 g via INTRAVENOUS
  Filled 2012-09-02 (×2): qty 1

## 2012-09-02 NOTE — Progress Notes (Addendum)
Patient neither seen nor  examined by me  Was requested to give necessary orders, ordered IV fluids and antibiotics for presumed pyelonephritis Patient stable

## 2012-09-02 NOTE — Telephone Encounter (Signed)
PENDING ACCEPTANCE TRANFER NOTE:  Call received from:   Gillermina Hu FNP  REASON FOR REQUESTING TRANSFER:    Pyelonephritis, ESBL Escherichia coli  HPI:   Mrs. Whitney Chambers is a 42 year old female with past medical history of hypothyroidism, she was presented to primary care physician was UTI symptoms, on Monday she was started on one dose of Rocephin and oral ciprofloxacin. Patient continues to have fever and chills, CT scan of abdomen pelvis was done yesterday and suggests left-sided pyelonephritis. Urine culture is back today and it showed ESBL Escherichia coli, patient will be admitted for further IV antibiotics. The urine culture results will be with the patient.   PLAN:  According to telephone report, this patient was accepted for transfer to [facility],   Under [facility] team:  *10*,  I have requested an order be written to call Flow Manager at [phone] upon patient arrival to the floor for final physician assignment who will do the admission and give admitting orders.  SIGNED: Clint Lipps, MD [facility]  09/02/2012, 2:26 PM

## 2012-09-02 NOTE — H&P (Signed)
[facility] History and Physical  Whitney Chambers VWU:[id number] DOB: 01/26/1971 DOA: 09/02/2012  Referring physician: ED PCP: Willow Ora, MD  Specialists: None  Chief Complaint: Pyelonephritis  HPI: Whitney Chambers is a 42 y.o. female with past medical history of hypothyroidism, she was presented to primary care physician was UTI symptoms, on Monday she was started on one dose of Rocephin and oral ciprofloxacin. Patient continues to have fever and chills, CT scan of abdomen pelvis was done yesterday and suggests left-sided pyelonephritis. Urine culture is back today and it showed ESBL Escherichia coli, patient will be admitted for further IV antibiotics. The urine culture results will be with the patient.  On arrival patient is stable and actually even feeling better she says and not having any flank pain at this point.  Urine culture results demonstrated an ESBL that was sensitive to nitrofurantoin and carbapenems (ertapenem and imipenem were both sensitive) and resistant to other ABx including rocephin.  Review of Systems: 12 systems reviewed and otherwise negative.  Past Medical History  Diagnosis Date  . Gallstones   . Thyroid disease     hypothyroidism  . Fibroids    Past Surgical History  Procedure Laterality Date  . Cesarean section     Social History:  reports that she has never smoked. She does not have any smokeless tobacco history on file. She reports that  drinks alcohol. She reports that she does not use illicit drugs.   No Known Allergies  No family history on file.  Prior to Admission medications   Medication Sig Start Date End Date Taking? Authorizing Provider  ciprofloxacin (CIPRO) 500 MG tablet Take 500 mg by mouth 2 (two) times daily. For 10 days. Started 6.24.14 ending 7.4.14   Yes Historical Provider, MD  HYDROcodone-acetaminophen (NORCO/VICODIN) 5-325 MG per tablet Take 2 tablets by mouth every 4 (four) hours as needed. 09/01/12  Yes Elson Areas,  PA-C  levothyroxine (SYNTHROID, LEVOTHROID) 137 MCG tablet Take 137 mcg by mouth daily before breakfast.   Yes Historical Provider, MD  promethazine (PHENERGAN) 12.5 MG tablet Take 12.5 mg by mouth 3 (three) times daily as needed for nausea.   Yes Historical Provider, MD   Physical Exam: Filed Vitals:   09/02/12 1828  BP: 112/62  Pulse: 72  Temp: 98.3 F (36.8 C)  TempSrc: Oral  Resp: 18  Height: 5\' 3"  (1.6 m)  Weight: 68.357 kg (150 lb 11.2 oz)  SpO2: 100%    General:  NAD, resting comfortably in bed Eyes: PEERLA EOMI ENT: mucous membranes moist Neck: supple w/o JVD Cardiovascular: RRR w/o MRG Respiratory: CTA B Abdomen: soft, nt, nd, bs+ Skin: no rash nor lesion Musculoskeletal: MAE, full ROM all 4 extremities Psychiatric: normal tone and affect Neurologic: AAOx3, grossly non-focal  Labs on Admission:  Basic Metabolic Panel:  Recent Labs Lab 09/01/12 1332  NA 137  K 4.1  CL 103  CO2 25  GLUCOSE 104*  BUN 10  CREATININE 0.60  CALCIUM 9.4   Liver Function Tests:  Recent Labs Lab 09/01/12 1332  AST 34  ALT 35  ALKPHOS 70  BILITOT 0.8  PROT 7.2  ALBUMIN 3.3*    Recent Labs Lab 09/01/12 1332  LIPASE 28   No results found for this basename: AMMONIA,  in the last 168 hours CBC:  Recent Labs Lab 09/01/12 1332  WBC 5.0  NEUTROABS 3.7  HGB 10.3*  HCT 30.1*  MCV 88.3  PLT 168   Cardiac Enzymes: No results found  for this basename: CKTOTAL, CKMB, CKMBINDEX, TROPONINI,  in the last 168 hours  BNP (last 3 results) No results found for this basename: PROBNP,  in the last 8760 hours CBG: No results found for this basename: GLUCAP,  in the last 168 hours  Radiological Exams on Admission: US Abdomen Complete  09/01/2012   *RADIOLOGY REPORT*  Clinical Data:  Epigastric abdominal pain.  Nausea.  Fever. Current history of gallstones.  COMPLETE ABDOMINAL ULTRASOUND  Comparison:  Abdominal ultrasound 01/14/2011.  Findings:  Gallbladder:  Numerous  shadowing calculi the largest on the order of 6 mm in size.  No gallbladder wall thickening or pericholecystic fluid.  Negative sonographic Murphy's sign according to the ultrasound technologist.  Common bile duct:  Normal in caliber with maximum diameter approximating 2 mm.  Liver:  No focal mass lesion seen.  Within normal limits in parenchymal echogenicity.  IVC:  Patent.  Pancreas:  Although the pancreas is difficult to visualize in its entirety, no focal pancreatic abnormality is identified.  Spleen:  Normal size and echotexture without focal parenchymal abnormality.  Right Kidney:  No hydronephrosis.  Well-preserved cortex.  No shadowing calculi.  Normal size and parenchymal echotexture without focal abnormalities.  Approximately 11.2 cm in length.  Left Kidney:  No hydronephrosis.  Small extrarenal pelvis.  Well- preserved cortex.  No shadowing calculi.  Normal size and parenchymal echotexture without focal abnormalities.  Approximately 10.9 cm in length.  Abdominal aorta:  Normal in caliber throughout its visualized course in the abdomen without significant atherosclerosis.  IMPRESSION:  1.  Cholelithiasis without sonographic evidence of acute cholecystitis. 2.  Otherwise normal examination.   Original Report Authenticated By: Hulan Saas, M.D.   Ct Abdomen Pelvis W Contrast  09/01/2012   *RADIOLOGY REPORT*  Clinical Data: Epigastric abdominal pain.  Nausea.  Gallstones.  CT ABDOMEN AND PELVIS WITH CONTRAST  Technique:  Multidetector CT imaging of the abdomen and pelvis was performed following the standard protocol during bolus administration of intravenous contrast.  Contrast: 50mL OMNIPAQUE IOHEXOL 300 MG/ML  SOLN, OMNIPAQUE IOHEXOL 300 MG/ML  SOLN  Comparison: 09/01/2012.  Findings: Trace bilateral pleural effusions are present.  Small pericardial effusion inferiorly, best appreciated on the parasagittal images. The liver, spleen, pancreas, and adrenal glands appear unremarkable.  The  gallstones seen on ultrasound are not readily apparent on today's CT.  No biliary dilatation noted.  No pathologic retroperitoneal or porta hepatis adenopathy is identified.  Appendix normal.  Borderline prominence of gas and stool in the colon, particularly proximally.  Suspected 2.6 x 1.6 x 1.8 cm fibroid in the posterior uterine body.  2.5 cm hypodensity associated with the left ovary, likely a cyst.  No free pelvic fluid observed.  Patchy hypoenhancement in the left kidney upper pole raises suspicion for inflammation, and is best appreciated on the delayed images.  IMPRESSION:  1.  Hypoenhancement in the left kidney upper pole, especially appreciable on delayed images, concerning for possible early pyelonephritis.  No specific renal arterial abnormality is observed to suggest predisposition for renal infarct. 2.  Trace bilateral pleural effusions and small pericardial effusion, etiology uncertain. 3.  Suspected small posterior uterine body fibroid.   Original Report Authenticated By: Gaylyn Rong, M.D.    EKG: Independently reviewed.  Assessment/Plan Principal Problem:   Infection due to ESBL-producing Escherichia coli Active Problems:   UTI (urinary tract infection)   1. UTI due to ESBL e.coli -  Organism sensitive to ertapenem, so using ertapenem for treatment.  Given that patient had  symptoms of pyelo likely will need ~5 days or so of ABx treatment.  Probably should get PICC line for outpatient ABx tomorrow morning.  Reassured husband and patient that it is very unlikely that husband or children would become ill with this in the immediate future.    Code Status: Full Code (must indicate code status--if unknown or must be presumed, indicate so) Family Communication: Spoke with husband at bedside (indicate person spoken with, if applicable, with phone number if by telephone) Disposition Plan: Admit to inpatient (indicate anticipated LOS)  Time spent: 70 min  GARDNER, JARED M. [facility] Pager [phone]  If 7PM-7AM, please contact night-coverage [email or web address] Password [facility] 09/02/2012, 10:49 PM

## 2012-09-03 ENCOUNTER — Encounter (HOSPITAL_COMMUNITY): Payer: Self-pay | Admitting: General Practice

## 2012-09-03 DIAGNOSIS — A498 Other bacterial infections of unspecified site: Secondary | ICD-10-CM

## 2012-09-03 LAB — BASIC METABOLIC PANEL
Chloride: 107 mEq/L (ref 96–112)
Creatinine, Ser: 0.52 mg/dL (ref 0.50–1.10)
GFR calc Af Amer: >90 mL/min (ref >90)
Potassium: 4.4 mEq/L (ref 3.5–5.1)
Sodium: 138 mEq/L (ref 135–145)

## 2012-09-03 LAB — CBC
MCV: 87.3 fL (ref 78.0–100.0)
Platelets: 159 10*3/uL (ref 150–400)
RDW: 12.6 % (ref 11.5–15.5)
WBC: 4.2 10*3/uL (ref 4.0–10.5)

## 2012-09-03 MED ORDER — SODIUM CHLORIDE 0.9 % IJ SOLN
10.0000 mL | INTRAMUSCULAR | Status: DC | PRN
  Administered 2012-09-03: 10 mL

## 2012-09-03 MED ORDER — HEPARIN SOD (PORK) LOCK FLUSH 100 UNIT/ML IV SOLN
250.0000 [IU] | Freq: Every day | INTRAVENOUS | Status: DC
  Filled 2012-09-03: qty 3

## 2012-09-03 MED ORDER — HEPARIN SOD (PORK) LOCK FLUSH 100 UNIT/ML IV SOLN
250.0000 [IU] | INTRAVENOUS | Status: DC | PRN
  Administered 2012-09-03: 250 [IU]
  Filled 2012-09-03: qty 3

## 2012-09-03 MED ORDER — SODIUM CHLORIDE 0.9 % IV SOLN: 1.0000 g | INTRAVENOUS | Status: DC

## 2012-09-03 NOTE — Discharge Summary (Signed)
Physician Discharge Summary  Whitney Chambers WGN:[id number] DOB: Feb 10, 1971 DOA: 09/02/2012  PCP: Willow Ora, MD  Admit date: 09/02/2012 Discharge date: 09/03/2012  Time spent: 20 minutes  Recommendations for Outpatient Follow-up:  1. Follow up with PCP within one week 2. Remove PICC line after finishing 5 days of IV antibiotics  Discharge Diagnoses:  Principal Problem:   Infection due to ESBL-producing Escherichia coli Active Problems:   UTI (urinary tract infection)   Discharge Condition: Stable  Diet recommendation: Regular  Filed Weights   09/02/12 1828  Weight: 68.357 kg (150 lb 11.2 oz)    History of present illness:  Whitney Chambers is a 42 y.o. female with past medical history of hypothyroidism, she was presented to primary care physician was UTI symptoms, on Monday she was started on one dose of Rocephin and oral ciprofloxacin. Patient continues to have fever and chills, CT scan of abdomen pelvis was done yesterday and suggests left-sided pyelonephritis. Urine culture is back today and it showed ESBL Escherichia coli, patient will be admitted for further IV antibiotics. The urine culture results will be with the patient.  On arrival patient is stable and actually even feeling better she says and not having any flank pain at this point. Urine culture results demonstrated an ESBL that was sensitive to nitrofurantoin and carbapenems (ertapenem and imipenem were both sensitive) and resistant to other ABx including rocephin.  Hospital Course:  The patient was continued with ertapenem during this hospital course based on urine cultures and sensitivities. She remained afebrile with no leukocytosis. A PICC line was placed on 09/03/12 for continuation of antibiotics at home.  Procedures:  PICC placement 09/03/12  Discharge Exam: Filed Vitals:   09/02/12 2105 09/03/12 0230 09/03/12 0602 09/03/12 1005  BP: 115/75 117/55 106/68 123/60  Pulse: 74 73 60 72  Temp: 98.5 F (36.9  C) 98.2 F (36.8 C) 97.8 F (36.6 C) 97.9 F (36.6 C)  TempSrc: Oral   Oral  Resp: 17 17 17 17   Height:      Weight:      SpO2: 100% 98% 100% 100%    General: Awake, in nad Cardiovascular: regular, s1, s2 Respiratory: normal resp effort, no wheezing  Discharge Instructions     Medication List    STOP taking these medications       ciprofloxacin 500 MG tablet  Commonly known as:  CIPRO      TAKE these medications       HYDROcodone-acetaminophen 5-325 MG per tablet  Commonly known as:  NORCO/VICODIN  Take 2 tablets by mouth every 4 (four) hours as needed.     levothyroxine 137 MCG tablet  Commonly known as:  SYNTHROID, LEVOTHROID  Take 137 mcg by mouth daily before breakfast.     promethazine 12.5 MG tablet  Commonly known as:  PHENERGAN  Take 12.5 mg by mouth 3 (three) times daily as needed for nausea.     sodium chloride 0.9 % SOLN 50 mL with ertapenem 1 G SOLR 1 g  Inject 1 g into the vein daily.       No Known Allergies Follow-up Information   Follow up with Willow Ora, MD In 1 week.   Contact information:   [address] [facility] [address] [place] [address] [phone]        The results of significant diagnostics from this hospitalization (including imaging, microbiology, ancillary and laboratory) are listed below for reference.    Significant Diagnostic Studies: US Abdomen Complete  09/01/2012   *RADIOLOGY REPORT*  Clinical Data:  Epigastric abdominal pain.  Nausea.  Fever. Current history of gallstones.  COMPLETE ABDOMINAL ULTRASOUND  Comparison:  Abdominal ultrasound 01/14/2011.  Findings:  Gallbladder:  Numerous shadowing calculi the largest on the order of 6 mm in size.  No gallbladder wall thickening or pericholecystic fluid.  Negative sonographic Murphy's sign according to the ultrasound technologist.  Common bile duct:  Normal in caliber with maximum diameter approximating 2 mm.  Liver:  No focal mass lesion seen.  Within normal  limits in parenchymal echogenicity.  IVC:  Patent.  Pancreas:  Although the pancreas is difficult to visualize in its entirety, no focal pancreatic abnormality is identified.  Spleen:  Normal size and echotexture without focal parenchymal abnormality.  Right Kidney:  No hydronephrosis.  Well-preserved cortex.  No shadowing calculi.  Normal size and parenchymal echotexture without focal abnormalities.  Approximately 11.2 cm in length.  Left Kidney:  No hydronephrosis.  Small extrarenal pelvis.  Well- preserved cortex.  No shadowing calculi.  Normal size and parenchymal echotexture without focal abnormalities.  Approximately 10.9 cm in length.  Abdominal aorta:  Normal in caliber throughout its visualized course in the abdomen without significant atherosclerosis.  IMPRESSION:  1.  Cholelithiasis without sonographic evidence of acute cholecystitis. 2.  Otherwise normal examination.   Original Report Authenticated By: Hulan Saas, M.D.   Ct Abdomen Pelvis W Contrast  09/01/2012   *RADIOLOGY REPORT*  Clinical Data: Epigastric abdominal pain.  Nausea.  Gallstones.  CT ABDOMEN AND PELVIS WITH CONTRAST  Technique:  Multidetector CT imaging of the abdomen and pelvis was performed following the standard protocol during bolus administration of intravenous contrast.  Contrast: 50mL OMNIPAQUE IOHEXOL 300 MG/ML  SOLN, OMNIPAQUE IOHEXOL 300 MG/ML  SOLN  Comparison: 09/01/2012.  Findings: Trace bilateral pleural effusions are present.  Small pericardial effusion inferiorly, best appreciated on the parasagittal images. The liver, spleen, pancreas, and adrenal glands appear unremarkable.  The gallstones seen on ultrasound are not readily apparent on today's CT.  No biliary dilatation noted.  No pathologic retroperitoneal or porta hepatis adenopathy is identified.  Appendix normal.  Borderline prominence of gas and stool in the colon, particularly proximally.  Suspected 2.6 x 1.6 x 1.8 cm fibroid in the posterior uterine  body.  2.5 cm hypodensity associated with the left ovary, likely a cyst.  No free pelvic fluid observed.  Patchy hypoenhancement in the left kidney upper pole raises suspicion for inflammation, and is best appreciated on the delayed images.  IMPRESSION:  1.  Hypoenhancement in the left kidney upper pole, especially appreciable on delayed images, concerning for possible early pyelonephritis.  No specific renal arterial abnormality is observed to suggest predisposition for renal infarct. 2.  Trace bilateral pleural effusions and small pericardial effusion, etiology uncertain. 3.  Suspected small posterior uterine body fibroid.   Original Report Authenticated By: Gaylyn Rong, M.D.    Microbiology: Recent Results (from the past 240 hour(s))  URINE CULTURE     Status: None   Collection Time    09/01/12  1:45 PM      Result Value Range Status   Specimen Description URINE, CLEAN CATCH   Final   Special Requests NONE   Final   Culture  Setup Time 09/02/2012 01:36   Final   Colony Count NO GROWTH   Final   Culture NO GROWTH   Final   Report Status 09/02/2012 FINAL   Final     Labs: Basic Metabolic Panel:  Recent Labs Lab 09/01/12 1332 09/03/12 0640  NA 137 138  K 4.1 4.4  CL 103 107  CO2 25 27  GLUCOSE 104* 99  BUN 10 7  CREATININE 0.60 0.52  CALCIUM 9.4 8.5   Liver Function Tests:  Recent Labs Lab 09/01/12 1332  AST 34  ALT 35  ALKPHOS 70  BILITOT 0.8  PROT 7.2  ALBUMIN 3.3*    Recent Labs Lab 09/01/12 1332  LIPASE 28   No results found for this basename: AMMONIA,  in the last 168 hours CBC:  Recent Labs Lab 09/01/12 1332 09/03/12 0640  WBC 5.0 4.2  NEUTROABS 3.7  --   HGB 10.3* 8.6*  HCT 30.1* 25.4*  MCV 88.3 87.3  PLT 168 159   Cardiac Enzymes: No results found for this basename: CKTOTAL, CKMB, CKMBINDEX, TROPONINI,  in the last 168 hours BNP: BNP (last 3 results) No results found for this basename: PROBNP,  in the last 8760 hours CBG: No results  found for this basename: GLUCAP,  in the last 168 hours     Signed:  Neils Siracusa K  [facility] 09/03/2012, 12:28 PM

## 2012-09-03 NOTE — Progress Notes (Signed)
UR COMPLETED  

## 2012-09-03 NOTE — Progress Notes (Signed)
Peripherally Inserted Central Catheter/Midline Placement  The IV Nurse has discussed with the patient and/or persons authorized to consent for the patient, the purpose of this procedure and the potential benefits and risks involved with this procedure.  The benefits include less needle sticks, lab draws from the catheter and patient may be discharged home with the catheter.  Risks include, but not limited to, infection, bleeding, blood clot (thrombus formation), and puncture of an artery; nerve damage and irregular heat beat.  Alternatives to this procedure were also discussed.  PICC/Midline Placement Documentation        Whitney Chambers 09/03/2012, 11:52 AM

## 2012-09-03 NOTE — Plan of Care (Signed)
Problem: Discharge Progression Outcomes Goal: Temperature < 100 x 24 hrs on PO antibiotics Outcome: Adequate for Discharge Pt home with IV antibiotics

## 2012-09-03 NOTE — Progress Notes (Addendum)
Discharge instructions reviewed with pt and prescriptions given.  Pt verbalized understanding and had no questions.  HH RN arranged for IV antibiotics.  Pt discharged in stable condition with husband.  Hector Shade [place]

## 2012-09-03 NOTE — ED Provider Notes (Signed)
Medical screening examination/treatment/procedure(s) were performed by non-physician practitioner and as supervising physician I was immediately available for consultation/collaboration.   Gwyneth Sprout, MD 09/03/12 [phone]

## 2012-09-03 NOTE — Progress Notes (Signed)
[facility]  Patient Status:   New pt to [facility] services  [facility] is providing the following services: Pt will receive HH Nursing and Infusion Pharmacy services,  [facility] Infusion Coordinator provided in hospital teaching for IV Invanz administration for independence at home.  RN will continue teaching at home.  Pt set for DC home today.  If patient discharges after hours, please call [phone].   Whitney Chambers 09/03/2012, 4:40 PM

## 2012-09-03 NOTE — Care Management Note (Signed)
  Page 1 of 1   09/03/2012     11:01:10 AM   CARE MANAGEMENT NOTE 09/03/2012  Patient:  AKILA, BATTA   Account Number:  [id number]  Date Initiated:  09/03/2012  Documentation initiated by:  Ronny Flurry  Subjective/Objective Assessment:     Action/Plan:   Anticipated DC Date:  09/03/2012   Anticipated DC Plan:  HOME W HOME HEALTH SERVICES         Choice offered to / List presented to:  C-1 Patient        HH arranged  HH-1 RN      [facility] agency  [facility]   Status of service:  Completed, signed off Medicare Important Message given?   (If response is "NO", the following Medicare IM given date fields will be blank) Date Medicare IM given:   Date Additional Medicare IM given:    Discharge Disposition:  HOME W HOME HEALTH SERVICES  Per UR Regulation:    If discussed at Long Length of Stay Meetings, dates discussed:    Comments:

## 2013-01-14 ENCOUNTER — Other Ambulatory Visit: Payer: Self-pay

## 2013-10-27 ENCOUNTER — Encounter (INDEPENDENT_AMBULATORY_CARE_PROVIDER_SITE_OTHER): Payer: Self-pay | Admitting: Surgery

## 2013-10-27 ENCOUNTER — Ambulatory Visit (INDEPENDENT_AMBULATORY_CARE_PROVIDER_SITE_OTHER): Payer: 59 | Admitting: Surgery

## 2013-10-27 VITALS — BP 116/64 | HR 76 | Temp 97.5°F | Resp 18 | Ht 63.0 in | Wt 147.0 lb

## 2013-10-27 DIAGNOSIS — K8010 Calculus of gallbladder with chronic cholecystitis without obstruction: Secondary | ICD-10-CM | POA: Insufficient documentation

## 2013-10-27 NOTE — Patient Instructions (Signed)
  [facility]  LAPAROSCOPIC SURGERY - POST-OP INSTRUCTIONS  Always review your discharge instruction sheet given to you by the facility where your surgery was performed.  A prescription for pain medication may be given to you upon discharge.  Take your pain medication as prescribed.  If narcotic pain medicine is not needed, then you may take acetaminophen (Tylenol) or ibuprofen (Advil) as needed.  Take your usually prescribed medications unless otherwise directed.  If you need a refill on your pain medication, please contact your pharmacy.  They will contact our office to request authorization. Prescriptions will not be filled after 5 P.M. or on weekends.  You should follow a light diet the first few days after arrival home, such as soup and crackers or toast.  Be sure to include plenty of fluids daily.  Most patients will experience some swelling and bruising in the area of the incisions.  Ice packs will help.  Swelling and bruising can take several days to resolve.   It is common to experience some constipation if taking pain medication after surgery.  Increasing fluid intake and taking a stool softener (such as Colace) will usually help or prevent this problem from occurring.  A mild laxative (Milk of Magnesia or Miralax) should be taken according to package instructions if there are no bowel movements after 48 hours.  Unless discharge instructions indicate otherwise, you may remove your bandages 24-48 hours after surgery, and you may shower at that time.  You may have steri-strips (small skin tapes) in place directly over the incision.  These strips should be left on the skin for 7-10 days.  If your surgeon used skin glue on the incision, you may shower in 24 hours.  The glue will flake off over the next 2-3 weeks.  Any sutures or staples will be removed at the office during your follow-up visit.  ACTIVITIES:  You may resume regular (light) daily activities beginning the  next day-such as daily self-care, walking, climbing stairs-gradually increasing activities as tolerated.  You may have sexual intercourse when it is comfortable.  Refrain from any heavy lifting or straining until approved by your doctor.  You may drive when you are no longer taking prescription pain medication, you can comfortably wear a seatbelt, and you can safely maneuver your car and apply brakes.  You should see your doctor in the office for a follow-up appointment approximately 2-3 weeks after your surgery.  Make sure that you call for this appointment within a day or two after you arrive home to insure a convenient appointment time.  WHEN TO CALL YOUR DOCTOR: 1. Fever over 101.0 2. Inability to urinate 3. Continued bleeding from incision 4. Increased pain, redness, or drainage from the incision 5. Increasing abdominal pain  The clinic staff is available to answer your questions during regular business hours.  Please don't hesitate to call and ask to speak to one of the nurses for clinical concerns.  If you have a medical emergency, go to the nearest emergency room or call 911.  A surgeon from [facility] is always on call for the hospital.  Ian Cavey M. Annastyn Silvey, MD, FACS [facility] Office: [phone] Toll Free:  [phone] FAX [phone]  Web site: [email or web address] 

## 2013-10-27 NOTE — Progress Notes (Signed)
General Surgery [facility]  Chief Complaint  Patient presents with  . New Evaluation    Evaluate gallbladder - referral from [facility], PA-C, [place] [facility]    HISTORY: Patient is a 43 year old female referred by her primary care provider for evaluation of right upper cautery abdominal pain associated with known cholelithiasis. Patient was originally evaluated in our practice in 2012. She has continued to have intermittent right upper quadrant abdominal pain radiating to the back. This occurs weekly. It does not appear to be associated with any particular foods. It occurs during the day and occasionally at night. Patient has experienced nausea and on occasion emesis. She denies fevers or chills. She denies jaundice or acholic stools.  Ultrasound examination shows multiple gallstones. The largest stone measures approximately 6 mm in size.  Previous abdominal surgery includes cesarean section.  Patient's sister underwent cholecystectomy and [place]. Her brother also has gallstones.  Past Medical History  Diagnosis Date  . Gallstones   . Thyroid disease     hypothyroidism  . Fibroids   . Pyelonephritis 09/02/2012    Current Outpatient Prescriptions  Medication Sig Dispense Refill  . levothyroxine (SYNTHROID, LEVOTHROID) 137 MCG tablet Take 137 mcg by mouth daily before breakfast.       No current facility-administered medications for this visit.    No Known Allergies  History reviewed. No pertinent family history.  History   Social History  . Marital Status: Single    Spouse Name: N/A    Number of Children: N/A  . Years of Education: N/A   Social History Main Topics  . Smoking status: Never Smoker   . Smokeless tobacco: Never Used  . Alcohol Use: Yes     Comment: occasional  . Drug Use: No  . Sexual Activity: None   Other Topics Concern  . None   Social History Narrative  . None    REVIEW OF SYSTEMS - PERTINENT POSITIVES  ONLY: Intermittent right upper quadrant abdominal pain. Nausea. Occasional emesis. Denies jaundice or acholic stools.  EXAM: Filed Vitals:   10/27/13 0924  BP: 116/64  Pulse: 76  Temp: 97.5 F (36.4 C)  Resp: 18    GENERAL: well-developed, well-nourished, no acute distress HEENT: normocephalic; pupils equal and reactive; sclerae clear; dentition good; mucous membranes moist NECK:  No palpable masses in the thyroid bed; symmetric on extension; no palpable anterior or posterior cervical lymphadenopathy; no supraclavicular masses; no tenderness CHEST: clear to auscultation bilaterally without rales, rhonchi, or wheezes CARDIAC: regular rate and rhythm without significant murmur; peripheral pulses are full ABDOMEN: soft without distension; bowel sounds present; no mass; no hepatosplenomegaly; no hernia; mild tenderness right upper quadrant to deep palpation EXT:  non-tender without edema; no deformity NEURO: no gross focal deficits; no sign of tremor   LABORATORY RESULTS: See [facility] (CHL-Epic) for most recent results  RADIOLOGY RESULTS: See [facility] (CHL-Epic) for most recent results  IMPRESSION: Chronic cholecystitis, cholelithiasis  PLAN: The patient and I have discussed the above information and review her ultrasound report. I have recommended that she proceed with laparoscopic cholecystectomy with intraoperative cholangiography. Written information was given to the patient to review at home. Risk and benefits are discussed including the possibility of conversion to open surgery. Patient understands and would like to proceed with surgery in the near future.  The risks and benefits of the procedure have been discussed at length with the patient.  The patient understands the proposed procedure, potential alternative treatments, and  the course of recovery to be expected.  All of the patient's questions have been answered at this time.  The patient wishes to proceed with  surgery.  Earnstine Regal, MD, [facility]  Primary Care Physician: Kathlene November, MD

## 2013-10-29 ENCOUNTER — Encounter (INDEPENDENT_AMBULATORY_CARE_PROVIDER_SITE_OTHER): Payer: Self-pay

## 2013-11-23 ENCOUNTER — Encounter (HOSPITAL_COMMUNITY): Payer: Self-pay | Admitting: Pharmacy Technician

## 2013-11-29 ENCOUNTER — Encounter (HOSPITAL_COMMUNITY)
Admission: RE | Admit: 2013-11-29 | Discharge: 2013-11-29 | Disposition: A | Discharge status: Home / Self Care | Payer: 59 | Source: Ambulatory Visit | Attending: Surgery | Admitting: Surgery

## 2013-11-29 ENCOUNTER — Encounter (HOSPITAL_COMMUNITY): Payer: Self-pay

## 2013-11-29 DIAGNOSIS — K8010 Calculus of gallbladder with chronic cholecystitis without obstruction: Secondary | ICD-10-CM | POA: Insufficient documentation

## 2013-11-29 DIAGNOSIS — Z01812 Encounter for preprocedural laboratory examination: Secondary | ICD-10-CM | POA: Diagnosis present

## 2013-11-29 HISTORY — DX: Iron deficiency: E61.1

## 2013-11-29 HISTORY — DX: Headache: R51

## 2013-11-29 HISTORY — DX: Gastro-esophageal reflux disease without esophagitis: K21.9

## 2013-11-29 LAB — CBC
HEMATOCRIT: 34.5 % — AB (ref 36.0–46.0)
Hemoglobin: 11.7 g/dL — ABNORMAL LOW (ref 12.0–15.0)
MCH: 29.5 pg (ref 26.0–34.0)
MCHC: 33.9 g/dL (ref 30.0–36.0)
MCV: 87.1 fL (ref 78.0–100.0)
PLATELETS: 259 10*3/uL (ref 150–400)
RBC: 3.96 MIL/uL (ref 3.87–5.11)
RDW: 12.5 % (ref 11.5–15.5)
WBC: 6.6 10*3/uL (ref 4.0–10.5)

## 2013-11-29 LAB — HCG, SERUM, QUALITATIVE: Preg, Serum: NEGATIVE

## 2013-11-29 NOTE — Patient Instructions (Addendum)
Whitney Chambers  11/29/2013                           YOUR PROCEDURE IS SCHEDULED ON: 12/06/13               ENTER THRU [facility] MAIN HOSPITAL ENTRANCE AND                          FOLLOW  SIGNS TO SHORT STAY CENTER                 ARRIVE AT SHORT STAY AT:  5:30 AM               CALL THIS NUMBER IF ANY PROBLEMS THE DAY OF SURGERY :               [phone]                                REMEMBER:   Do not eat food or drink liquids AFTER MIDNIGHT               STOP ASPIRIN AND HERBAL MEDS 5 DAYS PREOP                  Take these medicines the morning of surgery with               A SIPS OF WATER :     THYROID MEDICATION    Do not wear jewelry, make-up   Do not wear lotions, powders, or perfumes.   Do not shave legs or underarms 12 hrs. before surgery (men may shave face)  Do not bring valuables to the hospital.  Contacts, dentures or bridgework may not be worn into surgery.  Leave suitcase in the car. After surgery it may be brought to your room.  For patients admitted to the hospital more than one night, checkout time is            11:00 AM                                                       The day of discharge.   Patients discharged the day of surgery will not be allowed to drive home.            If going home same day of surgery, must have someone stay with you              FIRST 24 hrs at home and arrange for some one to drive you              home from hospital.   ________________________________________________________________________  [place]  Before surgery, you can play an important role.  Because skin is not sterile, your skin needs to be as free of germs as possible.  You can reduce the number of germs on your skin by washing with CHG (chlorahexidine gluconate) soap before surgery.  CHG is an antiseptic cleaner which  kills germs and bonds with the skin to continue killing germs even after washing. Please DO NOT use if you have an allergy to CHG or antibacterial soaps.  If your skin becomes reddened/irritated stop using the CHG and inform your nurse when you arrive at Short Stay. Do not shave (including legs and underarms) for at least 48 hours prior to the first CHG shower.  You may shave your face. Please follow these instructions carefully:   1.  Shower with CHG Soap the night before surgery and the  morning of Surgery.   2.  If you choose to wash your hair, wash your hair first as usual with your  normal  Shampoo.   3.  After you shampoo, rinse your hair and body thoroughly to remove the  shampoo.                                         4.  Use CHG as you would any other liquid soap.  You can apply chg directly  to the skin and wash . Gently wash with scrungie or clean wascloth    5.  Apply the CHG Soap to your body ONLY FROM THE NECK DOWN.   Do not use on open                           Wound or open sores. Avoid contact with eyes, ears mouth and genitals (private parts).                        Genitals (private parts) with your normal soap.              6.  Wash thoroughly, paying special attention to the area where your surgery  will be performed.   7.  Thoroughly rinse your body with warm water from the neck down.   8.  DO NOT shower/wash with your normal soap after using and rinsing off  the CHG Soap .                9.  Pat yourself dry with a clean towel.             10.  Wear clean pajamas.             11.  Place clean sheets on your bed the night of your first shower and do not  sleep with pets.  Day of Surgery : Do not apply any lotions/deodorants the morning of surgery.  Please wear clean clothes to the hospital/surgery center.  FAILURE TO FOLLOW THESE INSTRUCTIONS MAY RESULT IN THE CANCELLATION OF YOUR SURGERY    PATIENT  SIGNATURE_________________________________  ______________________________________________________________________

## 2013-11-30 NOTE — Progress Notes (Signed)
Quick Note:  These results are acceptable for scheduled surgery.  Allante Beane M. Whitleigh Garramone, MD, FACS [facility] Office: [phone]   ______ 

## 2013-12-06 ENCOUNTER — Encounter (HOSPITAL_COMMUNITY): Payer: 59 | Admitting: Registered Nurse

## 2013-12-06 ENCOUNTER — Observation Stay (HOSPITAL_COMMUNITY)
Admission: RE | Admit: 2013-12-06 | Discharge: 2013-12-07 | Disposition: A | Discharge status: Home / Self Care | Payer: 59 | Source: Ambulatory Visit | Attending: Surgery | Admitting: Surgery

## 2013-12-06 ENCOUNTER — Encounter (HOSPITAL_COMMUNITY)
Admission: RE | Disposition: A | Discharge status: Home / Self Care | Payer: Self-pay | Source: Ambulatory Visit | Attending: Surgery

## 2013-12-06 ENCOUNTER — Encounter (HOSPITAL_COMMUNITY): Payer: Self-pay | Admitting: *Deleted

## 2013-12-06 ENCOUNTER — Ambulatory Visit (HOSPITAL_COMMUNITY): Payer: 59

## 2013-12-06 ENCOUNTER — Ambulatory Visit (HOSPITAL_COMMUNITY): Payer: 59 | Admitting: Registered Nurse

## 2013-12-06 DIAGNOSIS — Z79899 Other long term (current) drug therapy: Secondary | ICD-10-CM | POA: Insufficient documentation

## 2013-12-06 DIAGNOSIS — E039 Hypothyroidism, unspecified: Secondary | ICD-10-CM | POA: Diagnosis not present

## 2013-12-06 DIAGNOSIS — K219 Gastro-esophageal reflux disease without esophagitis: Secondary | ICD-10-CM | POA: Insufficient documentation

## 2013-12-06 DIAGNOSIS — K8010 Calculus of gallbladder with chronic cholecystitis without obstruction: Principal | ICD-10-CM | POA: Insufficient documentation

## 2013-12-06 HISTORY — PX: CHOLECYSTECTOMY: SHX55

## 2013-12-06 SURGERY — LAPAROSCOPIC CHOLECYSTECTOMY WITH INTRAOPERATIVE CHOLANGIOGRAM
Anesthesia: General

## 2013-12-06 MED ORDER — BUPIVACAINE-EPINEPHRINE 0.5% -1:200000 IJ SOLN
INTRAMUSCULAR | Status: DC | PRN
  Administered 2013-12-06: 20 mL

## 2013-12-06 MED ORDER — BUPIVACAINE-EPINEPHRINE (PF) 0.5% -1:200000 IJ SOLN
INTRAMUSCULAR | Status: AC
  Filled 2013-12-06: qty 30

## 2013-12-06 MED ORDER — DEXAMETHASONE SODIUM PHOSPHATE 10 MG/ML IJ SOLN
INTRAMUSCULAR | Status: AC
  Filled 2013-12-06: qty 1

## 2013-12-06 MED ORDER — ONDANSETRON HCL 4 MG/2ML IJ SOLN
4.0000 mg | Freq: Four times a day (QID) | INTRAMUSCULAR | Status: DC | PRN
Start: 2013-12-06 — End: 2013-12-07

## 2013-12-06 MED ORDER — HYDROMORPHONE HCL 1 MG/ML IJ SOLN
0.2500 mg | INTRAMUSCULAR | Status: DC | PRN
  Administered 2013-12-06 (×2): 0.25 mg via INTRAVENOUS

## 2013-12-06 MED ORDER — HYDROMORPHONE HCL 1 MG/ML IJ SOLN
1.0000 mg | INTRAMUSCULAR | Status: DC | PRN
  Administered 2013-12-06: 1 mg via INTRAVENOUS
  Filled 2013-12-06: qty 1

## 2013-12-06 MED ORDER — LIDOCAINE HCL (CARDIAC) 20 MG/ML IV SOLN
INTRAVENOUS | Status: AC
  Filled 2013-12-06: qty 5

## 2013-12-06 MED ORDER — GLYCOPYRROLATE 0.2 MG/ML IJ SOLN
INTRAMUSCULAR | Status: DC | PRN
  Administered 2013-12-06: .6 mg via INTRAVENOUS
  Administered 2013-12-06: 0.2 mg via INTRAVENOUS

## 2013-12-06 MED ORDER — PROPOFOL 10 MG/ML IV BOLUS
INTRAVENOUS | Status: AC
  Filled 2013-12-06: qty 20

## 2013-12-06 MED ORDER — KCL IN DEXTROSE-NACL 30-5-0.45 MEQ/L-%-% IV SOLN
INTRAVENOUS | Status: DC
  Filled 2013-12-06 (×3): qty 1000

## 2013-12-06 MED ORDER — ZOLPIDEM TARTRATE 5 MG PO TABS
5.0000 mg | ORAL_TABLET | Freq: Once | ORAL | Status: AC
  Administered 2013-12-06: 5 mg via ORAL
  Filled 2013-12-06: qty 1

## 2013-12-06 MED ORDER — MIDAZOLAM HCL 2 MG/2ML IJ SOLN
INTRAMUSCULAR | Status: AC
  Filled 2013-12-06: qty 2

## 2013-12-06 MED ORDER — LACTATED RINGERS IV SOLN
INTRAVENOUS | Status: DC | PRN
  Administered 2013-12-06: 1000 mL

## 2013-12-06 MED ORDER — ROCURONIUM BROMIDE 100 MG/10ML IV SOLN
INTRAVENOUS | Status: AC
  Filled 2013-12-06: qty 1

## 2013-12-06 MED ORDER — LACTATED RINGERS IV SOLN
INTRAVENOUS | Status: DC | PRN
  Administered 2013-12-06: 07:00:00 via INTRAVENOUS

## 2013-12-06 MED ORDER — HYDROMORPHONE HCL 1 MG/ML IJ SOLN
INTRAMUSCULAR | Status: AC
  Filled 2013-12-06: qty 1

## 2013-12-06 MED ORDER — ATROPINE SULFATE 0.4 MG/ML IJ SOLN
INTRAMUSCULAR | Status: AC
  Filled 2013-12-06: qty 2

## 2013-12-06 MED ORDER — FENTANYL CITRATE 0.05 MG/ML IJ SOLN
INTRAMUSCULAR | Status: AC
  Filled 2013-12-06: qty 5

## 2013-12-06 MED ORDER — DEXAMETHASONE SODIUM PHOSPHATE 10 MG/ML IJ SOLN
INTRAMUSCULAR | Status: DC | PRN
  Administered 2013-12-06: 10 mg via INTRAVENOUS

## 2013-12-06 MED ORDER — MIDAZOLAM HCL 5 MG/5ML IJ SOLN
INTRAMUSCULAR | Status: DC | PRN
  Administered 2013-12-06: 2 mg via INTRAVENOUS

## 2013-12-06 MED ORDER — HYDROCODONE-ACETAMINOPHEN 5-325 MG PO TABS
1.0000 | ORAL_TABLET | ORAL | Status: DC | PRN
  Administered 2013-12-06: 1 via ORAL
  Administered 2013-12-07: 2 via ORAL
  Filled 2013-12-06: qty 1
  Filled 2013-12-06: qty 2

## 2013-12-06 MED ORDER — LIDOCAINE HCL (CARDIAC) 20 MG/ML IV SOLN
INTRAVENOUS | Status: DC | PRN
  Administered 2013-12-06: 80 mg via INTRAVENOUS

## 2013-12-06 MED ORDER — SODIUM CHLORIDE 0.9 % IJ SOLN
INTRAMUSCULAR | Status: AC
  Filled 2013-12-06: qty 10

## 2013-12-06 MED ORDER — ONDANSETRON HCL 4 MG/2ML IJ SOLN
INTRAMUSCULAR | Status: DC | PRN
  Administered 2013-12-06: 4 mg via INTRAVENOUS

## 2013-12-06 MED ORDER — KETOROLAC TROMETHAMINE 30 MG/ML IJ SOLN
15.0000 mg | Freq: Once | INTRAMUSCULAR | Status: AC | PRN
  Administered 2013-12-06: 30 mg via INTRAVENOUS

## 2013-12-06 MED ORDER — CEFAZOLIN SODIUM-DEXTROSE 2-3 GM-% IV SOLR
2.0000 g | INTRAVENOUS | Status: AC
  Administered 2013-12-06: 2 g via INTRAVENOUS

## 2013-12-06 MED ORDER — FENTANYL CITRATE 0.05 MG/ML IJ SOLN
INTRAMUSCULAR | Status: DC | PRN
  Administered 2013-12-06 (×5): 50 ug via INTRAVENOUS

## 2013-12-06 MED ORDER — PROPOFOL 10 MG/ML IV BOLUS
INTRAVENOUS | Status: DC | PRN
  Administered 2013-12-06: 150 mg via INTRAVENOUS

## 2013-12-06 MED ORDER — KETOROLAC TROMETHAMINE 30 MG/ML IJ SOLN
INTRAMUSCULAR | Status: AC
  Filled 2013-12-06: qty 1

## 2013-12-06 MED ORDER — NEOSTIGMINE METHYLSULFATE 10 MG/10ML IV SOLN
INTRAVENOUS | Status: DC | PRN
  Administered 2013-12-06: 4 mg via INTRAVENOUS

## 2013-12-06 MED ORDER — ONDANSETRON HCL 4 MG/2ML IJ SOLN
INTRAMUSCULAR | Status: AC
  Filled 2013-12-06: qty 2

## 2013-12-06 MED ORDER — ROCURONIUM BROMIDE 100 MG/10ML IV SOLN
INTRAVENOUS | Status: DC | PRN
  Administered 2013-12-06: 50 mg via INTRAVENOUS

## 2013-12-06 MED ORDER — IOHEXOL 300 MG/ML  SOLN
INTRAMUSCULAR | Status: DC | PRN
  Administered 2013-12-06: 7.5 mL

## 2013-12-06 MED ORDER — EPHEDRINE SULFATE 50 MG/ML IJ SOLN
INTRAMUSCULAR | Status: AC
  Filled 2013-12-06: qty 1

## 2013-12-06 MED ORDER — GLYCOPYRROLATE 0.2 MG/ML IJ SOLN
INTRAMUSCULAR | Status: AC
  Filled 2013-12-06: qty 1

## 2013-12-06 MED ORDER — 0.9 % SODIUM CHLORIDE (POUR BTL) OPTIME
TOPICAL | Status: DC | PRN
  Administered 2013-12-06: 1000 mL

## 2013-12-06 MED ORDER — ACETAMINOPHEN 325 MG PO TABS
650.0000 mg | ORAL_TABLET | ORAL | Status: DC | PRN
  Administered 2013-12-06: 650 mg via ORAL
  Filled 2013-12-06: qty 2

## 2013-12-06 MED ORDER — PROMETHAZINE HCL 25 MG/ML IJ SOLN: 6.2500 mg | INTRAMUSCULAR | Status: DC | PRN

## 2013-12-06 MED ORDER — ONDANSETRON HCL 4 MG PO TABS: 4.0000 mg | ORAL_TABLET | Freq: Four times a day (QID) | ORAL | Status: DC | PRN

## 2013-12-06 MED ORDER — CEFAZOLIN SODIUM-DEXTROSE 2-3 GM-% IV SOLR
INTRAVENOUS | Status: AC
  Filled 2013-12-06: qty 50

## 2013-12-06 SURGICAL SUPPLY — 39 items
APL SKNCLS STERI-STRIP NONHPOA (GAUZE/BANDAGES/DRESSINGS) ×1
APPLIER CLIP ROT 10 11.4 M/L (STAPLE) ×3
APR CLP MED LRG 11.4X10 (STAPLE) ×1
BAG SPEC RTRVL LRG 6X4 10 (ENDOMECHANICALS) ×1
BENZOIN TINCTURE PRP APPL 2/3 (GAUZE/BANDAGES/DRESSINGS) ×3 IMPLANT
CABLE HIGH FREQUENCY MONO STRZ (ELECTRODE) ×3 IMPLANT
CANISTER SUCTION 2500CC (MISCELLANEOUS) ×3 IMPLANT
CHLORAPREP W/TINT 26ML (MISCELLANEOUS) ×3 IMPLANT
CLIP APPLIE ROT 10 11.4 M/L (STAPLE) ×1 IMPLANT
CLOSURE WOUND 1/2 X4 (GAUZE/BANDAGES/DRESSINGS) ×1
COVER MAYO STAND STRL (DRAPES) ×3 IMPLANT
DECANTER SPIKE VIAL GLASS SM (MISCELLANEOUS) ×3 IMPLANT
DRAPE C-ARM 42X120 X-RAY (DRAPES) ×3 IMPLANT
DRAPE LAPAROSCOPIC ABDOMINAL (DRAPES) ×3 IMPLANT
DRAPE UTILITY XL STRL (DRAPES) ×3 IMPLANT
ELECT REM PT RETURN 9FT ADLT (ELECTROSURGICAL) ×3
ELECTRODE REM PT RTRN 9FT ADLT (ELECTROSURGICAL) ×1 IMPLANT
GAUZE SPONGE 4X4 12PLY STRL (GAUZE/BANDAGES/DRESSINGS) ×2 IMPLANT
GLOVE SURG ORTHO 8.0 STRL STRW (GLOVE) ×3 IMPLANT
GOWN STRL REUS W/TWL XL LVL3 (GOWN DISPOSABLE) ×6 IMPLANT
HEMOSTAT SURGICEL 4X8 (HEMOSTASIS) IMPLANT
KIT BASIN OR (CUSTOM PROCEDURE TRAY) ×3 IMPLANT
NS IRRIG 1000ML POUR BTL (IV SOLUTION) ×3 IMPLANT
POUCH SPECIMEN RETRIEVAL 10MM (ENDOMECHANICALS) ×3 IMPLANT
SCISSORS LAP 5X35 DISP (ENDOMECHANICALS) ×3 IMPLANT
SET CHOLANGIOGRAPH MIX (MISCELLANEOUS) ×3 IMPLANT
SET IRRIG TUBING LAPAROSCOPIC (IRRIGATION / IRRIGATOR) ×3 IMPLANT
SLEEVE XCEL OPT CAN 5 100 (ENDOMECHANICALS) ×3 IMPLANT
SOLUTION ANTI FOG 6CC (MISCELLANEOUS) ×3 IMPLANT
STRIP CLOSURE SKIN 1/2X4 (GAUZE/BANDAGES/DRESSINGS) ×2 IMPLANT
SUT MNCRL AB 4-0 PS2 18 (SUTURE) ×3 IMPLANT
TAPE CLOTH SURG 4X10 WHT LF (GAUZE/BANDAGES/DRESSINGS) ×2 IMPLANT
TOWEL OR 17X26 10 PK STRL BLUE (TOWEL DISPOSABLE) ×3 IMPLANT
TOWEL OR NON WOVEN STRL DISP B (DISPOSABLE) ×3 IMPLANT
TRAY LAP CHOLE (CUSTOM PROCEDURE TRAY) ×3 IMPLANT
TROCAR BLADELESS OPT 5 100 (ENDOMECHANICALS) ×3 IMPLANT
TROCAR XCEL BLUNT TIP 100MML (ENDOMECHANICALS) ×3 IMPLANT
TROCAR XCEL NON-BLD 11X100MML (ENDOMECHANICALS) ×3 IMPLANT
TUBING INSUFFLATION 10FT LAP (TUBING) ×3 IMPLANT

## 2013-12-06 NOTE — H&P (Signed)
Whitney Chambers is an 43 y.o. female.    General Surgery [facility]  Chief Complaint: symptomatic cholelithiasis, abdominal pain  HPI: Patient is a 43 year old female referred by her primary care provider for evaluation of right upper quadrant abdominal pain associated with known cholelithiasis. Patient was originally evaluated in our practice in 2012. She has continued to have intermittent right upper quadrant abdominal pain radiating to the back. This occurs weekly. It does not appear to be associated with any particular foods. It occurs during the day and occasionally at night. Patient has experienced nausea and on occasion emesis. She denies fevers or chills. She denies jaundice or acholic stools.  Ultrasound examination shows multiple gallstones. The largest stone measures approximately 6 mm in size.  Previous abdominal surgery includes cesarean section.  Patient's sister underwent cholecystectomy and [place]. Her brother also has gallstones.    Past Medical History  Diagnosis Date  . Gallstones   . Thyroid disease     hypothyroidism  . Fibroids   . Pyelonephritis 09/02/2012  . Headache(784.0)   . GERD (gastroesophageal reflux disease)   . Low iron     Past Surgical History  Procedure Laterality Date  . Cesarean section      History reviewed. No pertinent family history. Social History:  reports that she has never smoked. She has never used smokeless tobacco. She reports that she drinks alcohol. She reports that she does not use illicit drugs.  Allergies: No Known Allergies  Medications Prior to Admission  Medication Sig Dispense Refill  . acetaminophen (TYLENOL) 500 MG tablet Take 1,000 mg by mouth every 6 (six) hours as needed for mild pain or headache.      . levothyroxine (SYNTHROID, LEVOTHROID) 125 MCG tablet Take 125 mcg by mouth daily before breakfast.        No results found for this or any previous visit (from the past 48 hour(s)). No  results found.  Review of Systems  Constitutional: Negative.   HENT: Negative.   Eyes: Negative.   Respiratory: Negative.   Cardiovascular: Negative.   Gastrointestinal: Positive for nausea, vomiting and abdominal pain (epigastric).  Genitourinary: Negative.   Musculoskeletal: Positive for back pain.  Skin: Negative.   Neurological: Negative.   Endo/Heme/Allergies: Negative.   Psychiatric/Behavioral: Negative.     Blood pressure 132/81, pulse 68, temperature 97.9 F (36.6 C), temperature source Oral, resp. rate 18, SpO2 99.00%. Physical Exam  Constitutional: She is oriented to person, place, and time. She appears well-developed and well-nourished. No distress.  HENT:  Head: Normocephalic and atraumatic.  Right Ear: External ear normal.  Left Ear: External ear normal.  Eyes: Conjunctivae are normal. Pupils are equal, round, and reactive to light. No scleral icterus.  Neck: Normal range of motion. Neck supple. No tracheal deviation present. No thyromegaly present.  Cardiovascular: Normal rate, regular rhythm and normal heart sounds.   No murmur heard. Respiratory: Effort normal and breath sounds normal. She has no wheezes.  GI: Soft. Bowel sounds are normal. She exhibits no distension. There is no tenderness.  Musculoskeletal: Normal range of motion. She exhibits no edema.  Neurological: She is alert and oriented to person, place, and time.  Skin: Skin is warm and dry.  Psychiatric: She has a normal mood and affect. Her behavior is normal.     Assessment/Plan Symptomatic cholelithiasis, chronic cholecystitis  Plan lap chole with IOC  The risks and benefits of the procedure have been discussed at length with the patient.  The  patient understands the proposed procedure, potential alternative treatments, and the course of recovery to be expected.  All of the patient's questions have been answered at this time.  The patient wishes to proceed with surgery.  Earnstine Regal, MD,  [facility] Office: [place] 12/06/2013, 6:45 AM

## 2013-12-06 NOTE — Anesthesia Postprocedure Evaluation (Signed)
  Anesthesia Post-op Note  Patient: Whitney Chambers  Procedure(s) Performed: Procedure(s) (LRB): LAPAROSCOPIC CHOLECYSTECTOMY WITH INTRAOPERATIVE CHOLANGIOGRAM (N/A)  Patient Location: PACU  Anesthesia Type: General  Level of Consciousness: awake and alert   Airway and Oxygen Therapy: Patient Spontanous Breathing  Post-op Pain: mild  Post-op Assessment: Post-op Vital signs reviewed, Patient's Cardiovascular Status Stable, Respiratory Function Stable, Patent Airway and No signs of Nausea or vomiting  Last Vitals:  Filed Vitals:   12/06/13 0941  BP: 147/81  Pulse: 56  Temp: 36.4 C  Resp: 12    Post-op Vital Signs: stable   Complications: No apparent anesthesia complications

## 2013-12-06 NOTE — Anesthesia Preprocedure Evaluation (Addendum)
Anesthesia Evaluation  Patient identified by MRN, date of birth, ID band Patient awake    Reviewed: Allergy & Precautions, H&P , NPO status , Patient's Chart, lab work & pertinent test results  Airway Mallampati: II TM Distance: >3 FB Neck ROM: Full    Dental no notable dental hx.    Pulmonary neg pulmonary ROS,  breath sounds clear to auscultation  Pulmonary exam normal       Cardiovascular negative cardio ROS  Rhythm:Regular Rate:Normal     Neuro/Psych negative neurological ROS  negative psych ROS   GI/Hepatic negative GI ROS, Neg liver ROS,   Endo/Other  Hypothyroidism   Renal/GU negative Renal ROS  negative genitourinary   Musculoskeletal negative musculoskeletal ROS (+)   Abdominal   Peds negative pediatric ROS (+)  Hematology negative hematology ROS (+)   Anesthesia Other Findings   Reproductive/Obstetrics negative OB ROS                           Anesthesia Physical Anesthesia Plan  ASA: II  Anesthesia Plan: General   Post-op Pain Management:    Induction: Intravenous  Airway Management Planned: Oral ETT  Additional Equipment:   Intra-op Plan:   Post-operative Plan: Extubation in OR  Informed Consent: I have reviewed the patients History and Physical, chart, labs and discussed the procedure including the risks, benefits and alternatives for the proposed anesthesia with the patient or authorized representative who has indicated his/her understanding and acceptance.   Dental advisory given  Plan Discussed with: CRNA and Surgeon  Anesthesia Plan Comments:         Anesthesia Quick Evaluation

## 2013-12-06 NOTE — Transfer of Care (Signed)
**Note De-Identified Whitney Obfuscation** Immediate Anesthesia Transfer of Care Note  Patient: Whitney Chambers  Procedure(s) Performed: Procedure(s): LAPAROSCOPIC CHOLECYSTECTOMY WITH INTRAOPERATIVE CHOLANGIOGRAM (N/A)  Patient Location: PACU  Anesthesia Type:General  Level of Consciousness: awake, alert , oriented and patient cooperative  Airway & Oxygen Therapy: Patient Spontanous Breathing and Patient connected to face mask oxygen  Post-op Assessment: Report given to PACU RN, Post -op Vital signs reviewed and stable and Patient moving all extremities  Post vital signs: Reviewed and stable  Complications: No apparent anesthesia complications

## 2013-12-06 NOTE — Op Note (Signed)
Procedure Note  Pre-operative Diagnosis:  Chronic cholecystitis, cholelithiasis  Post-operative Diagnosis:  same  Surgeon:  Earnstine Regal, MD, FACS  Assistant:  none   Procedure:  Laparoscopic cholecystectomy with intra-operative cholangiography  Anesthesia:  General  Estimated Blood Loss:  minimal  Drains: none         Specimen: Gallbladder to pathology  Indications:  43 yo female with symptomatic cholelithiasis.  For cholecystectomy.  Procedure Details:  The patient was seen in the pre-op holding area. The risks, benefits, complications, treatment options, and expected outcomes were previously discussed with the patient. The patient agreed with the proposed plan and has signed the informed consent form.  The patient was brought to the Operating Room, identified as Whitney Chambers and the procedure verified as laparoscopic cholecystectomy with intraoperative cholangiography. A "time out" was completed and the above information confirmed.  The patient was placed in the supine position. Following induction of general anesthesia, the abdomen was prepped and draped in the usual aseptic fashion.  An incision was made in the skin near the umbilicus. The midline fascia was incised and the peritoneal cavity was entered and a Hasson canula was introduced under direct vision.  The Hasson canula was secured with a 0-Vicryl pursestring suture. Pneumoperitoneum was established with carbon dioxide. Additional trocars were introduced under direct vision along the right costal margin in the midline, mid-clavicular line, and anterior axillary line.   The gallbladder was identified and the fundus grasped and retracted cephalad. Adhesions were taken down bluntly and the electrocautery was utilized as needed, taking care not to injure any adjacent structures. The infundibulum was grasped and retracted laterally, exposing the peritoneum overlying the triangle of Calot. The peritoneum was incised and  structures exposed with blunt dissection. The cystic duct was clearly identified, bluntly dissected circumferentially, and clipped at the neck of the gallbladder.  An incision was made in the cystic duct and the cholangiogram catheter introduced. The catheter was secured using an ligaclip.  Real-time cholangiography was performed using C-arm fluoroscopy.  There was rapid filling of a normal caliber common bile duct.  There was reflux of contrast into the left and right hepatic ductal systems.  The cystic duct appears to originate from the right hepatic duct, a known normal anatomic variant.  There was free flow distally into the duodenum without filling defect or obstruction.  The catheter was removed from the peritoneal cavity.  The cystic duct was then ligated with surgical clips and divided. The cystic artery was identified, dissected circumferentially, ligated with ligaclips, and divided.  The gallbladder was dissected away from the liver bed using the electrocautery for hemostasis. The gallbladder was completely removed from the liver and placed into an endocatch bag. The gallbladder was removed in the endocatch bag through the umbilical port site and submitted to pathology for review.  The right upper quadrant was irrigated and the gallbladder bed was inspected. Hemostasis was achieved with the electrocautery.  Pneumoperitoneum was released after viewing removal of the trocars with good hemostasis noted. The umbilical wound was irrigated and the fascia was then closed with the pursestring suture.  Local anesthetic was infiltrated at all port sites. The skin incisions were closed with 4-0 Monocril subcuticular sutures and steri-strips and dressings were applied.  Instrument, sponge, and needle counts were correct at the conclusion of the case.  The patient was awakened from anesthesia and brought to the recovery room in stable condition.  The patient tolerated the procedure well.   Whitney Chambers.  Harlow Asa, MD, [facility] Office: [phone]

## 2013-12-07 ENCOUNTER — Encounter (HOSPITAL_COMMUNITY): Payer: Self-pay | Admitting: Surgery

## 2013-12-07 DIAGNOSIS — K8010 Calculus of gallbladder with chronic cholecystitis without obstruction: Secondary | ICD-10-CM | POA: Diagnosis not present

## 2013-12-07 MED ORDER — OXYCODONE-ACETAMINOPHEN 5-325 MG PO TABS: 1.0000 | ORAL_TABLET | ORAL | Status: AC | PRN

## 2013-12-07 NOTE — Progress Notes (Signed)
UR completed 

## 2013-12-07 NOTE — Discharge Summary (Signed)
Physician Discharge Summary [facility]  Patient ID: Whitney Chambers MRN: [id number] DOB/AGE: [birth year] 43 y.o.  Admit date: 12/06/2013 Discharge date: 12/07/2013  Admission Diagnoses:  Symptomatic cholelithiasis, chronic cholecystitis  Discharge Diagnoses:  Principal Problem:   Cholelithiasis with cholecystitis Active Problems:   Cholecystitis with cholelithiasis   Discharged Condition: good  Hospital Course: Patient was admitted for observation following gallbladder surgery.  Post op course was uncomplicated.  Pain was well controlled.  Tolerated diet.  Patient was prepared for discharge home on POD#1.  Consults: None  Treatments: surgery: lap chole with IOC  Discharge Exam: Blood pressure 123/79, pulse 61, temperature 99 F (37.2 C), temperature source Oral, resp. rate 14, height 5\' 3"  (1.6 m), weight 148 lb 13 oz (67.5 kg), SpO2 100.00%. HEENT - clear Neck - soft Chest - clear bilaterally Cor - RRR Abd - soft without distension; dressings dry and intact  Disposition: Home  Discharge Instructions   Diet - low sodium heart healthy    Complete by:  As directed      Discharge instructions    Complete by:  As directed   [facility]  LAPAROSCOPIC SURGERY - POST-OP INSTRUCTIONS  Always review your discharge instruction sheet given to you by the facility where your surgery was performed.  A prescription for pain medication may be given to you upon discharge.  Take your pain medication as prescribed.  If narcotic pain medicine is not needed, then you may take acetaminophen (Tylenol) or ibuprofen (Advil) as needed.  Take your usually prescribed medications unless otherwise directed.  If you need a refill on your pain medication, please contact your pharmacy.  They will contact our office to request authorization. Prescriptions will not be filled after 5 P.M. or on weekends.  You should follow a light diet the first few days after  arrival home, such as soup and crackers or toast.  Be sure to include plenty of fluids daily.  Most patients will experience some swelling and bruising in the area of the incisions.  Ice packs will help.  Swelling and bruising can take several days to resolve.   It is common to experience some constipation if taking pain medication after surgery.  Increasing fluid intake and taking a stool softener (such as Colace) will usually help or prevent this problem from occurring.  A mild laxative (Milk of Magnesia or Miralax) should be taken according to package instructions if there are no bowel movements after 48 hours.  Unless discharge instructions indicate otherwise, you may remove your bandages 24-48 hours after surgery, and you may shower at that time.  You may have steri-strips (small skin tapes) in place directly over the incision.  These strips should be left on the skin for 7-10 days.  If your surgeon used skin glue on the incision, you may shower in 24 hours.  The glue will flake off over the next 2-3 weeks.  Any sutures or staples will be removed at the office during your follow-up visit.  ACTIVITIES:  You may resume regular (light) daily activities beginning the next day-such as daily self-care, walking, climbing stairs-gradually increasing activities as tolerated.  You may have sexual intercourse when it is comfortable.  Refrain from any heavy lifting or straining until approved by your doctor.  You may drive when you are no longer taking prescription pain medication, you can comfortably wear a seatbelt, and you can safely maneuver your car and apply brakes.  You should see your doctor  in the office for a follow-up appointment approximately 2-3 weeks after your surgery.  Make sure that you call for this appointment within a day or two after you arrive home to insure a convenient appointment time.  WHEN TO CALL YOUR DOCTOR: Fever over 101.0 Inability to urinate Continued bleeding from  incision Increased pain, redness, or drainage from the incision Increasing abdominal pain  The clinic staff is available to answer your questions during regular business hours.  Please don't hesitate to call and ask to speak to one of the nurses for clinical concerns.  If you have a medical emergency, go to the nearest emergency room or call 911.  A surgeon from [facility] is always on call for the hospital.  Earnstine Regal, MD, [facility] Office: [place] Free:  [phone] FAX [phone]  Web site: [email or web address]     Increase activity slowly    Complete by:  As directed      Remove dressing in 24 hours    Complete by:  As directed             Medication List         acetaminophen 500 MG tablet  Commonly known as:  TYLENOL  Take 1,000 mg by mouth every 6 (six) hours as needed for mild pain or headache.     levothyroxine 125 MCG tablet  Commonly known as:  SYNTHROID, LEVOTHROID  Take 125 mcg by mouth daily before breakfast.     oxyCODONE-acetaminophen 5-325 MG per tablet  Commonly known as:  ROXICET  Take 1-2 tablets by mouth every 4 (four) hours as needed for moderate pain.         Earnstine Regal, MD, [facility] Office: [phone]   Signed: Earnstine Regal 12/07/2013, 9:44 AM

## 2014-06-06 ENCOUNTER — Ambulatory Visit (HOSPITAL_BASED_OUTPATIENT_CLINIC_OR_DEPARTMENT_OTHER)
Admission: RE | Admit: 2014-06-06 | Discharge: 2014-06-06 | Disposition: A | Discharge status: Home / Self Care | Payer: 59 | Source: Ambulatory Visit | Attending: Physician Assistant | Admitting: Physician Assistant

## 2014-06-06 ENCOUNTER — Encounter (HOSPITAL_BASED_OUTPATIENT_CLINIC_OR_DEPARTMENT_OTHER): Payer: Self-pay

## 2014-06-06 ENCOUNTER — Other Ambulatory Visit (HOSPITAL_BASED_OUTPATIENT_CLINIC_OR_DEPARTMENT_OTHER): Payer: Self-pay | Admitting: Physician Assistant

## 2014-06-06 DIAGNOSIS — R1011 Right upper quadrant pain: Secondary | ICD-10-CM | POA: Insufficient documentation

## 2014-06-06 DIAGNOSIS — Z9049 Acquired absence of other specified parts of digestive tract: Secondary | ICD-10-CM | POA: Diagnosis not present

## 2014-06-06 MED ORDER — IOHEXOL 300 MG/ML  SOLN
100.0000 mL | Freq: Once | INTRAMUSCULAR | Status: AC | PRN
  Administered 2014-06-06: 100 mL via INTRAVENOUS

## 2014-08-12 IMAGING — CT CT ABD-PELV W/ CM
2 of 5 series · 16 of 46 positions shown, 18 images · IV contrast (APPLIED)
Comparison: 09/01/2012.

CLINICAL DATA: Epigastric abdominal pain.  Nausea.  Gallstones.

CT ABDOMEN AND PELVIS WITH CONTRAST
TECHNIQUE: Multidetector CT imaging of the abdomen and pelvis was
performed following the standard protocol during bolus
administration of intravenous contrast.
Contrast: 50mL OMNIPAQUE IOHEXOL 300 MG/ML  SOLN, 100mL OMNIPAQUE
IOHEXOL 300 MG/ML  SOLN

[Series 2: abd/pelvis 5.0 b31f · axial · 0.69mm/px · z∈[-651,-246]mm · 13 of 91 slices shown, 15 images]
[im 5/91  soft-tissue]
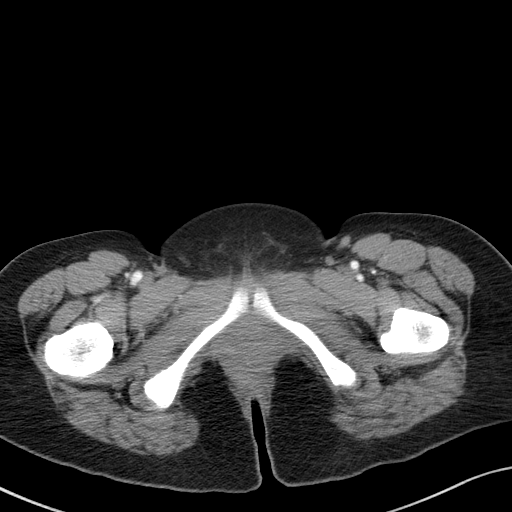
[im 5/91  bone]
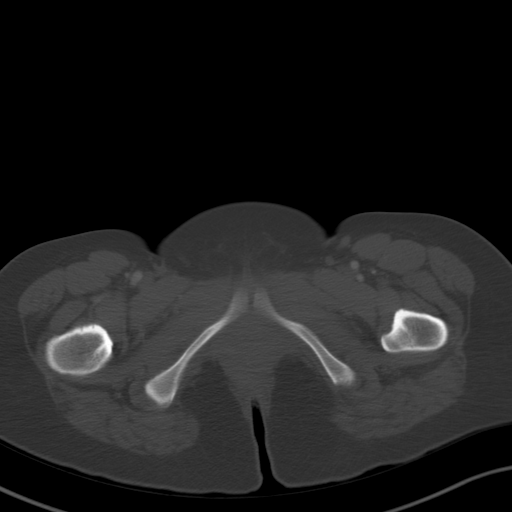
[im 14/91  soft-tissue]
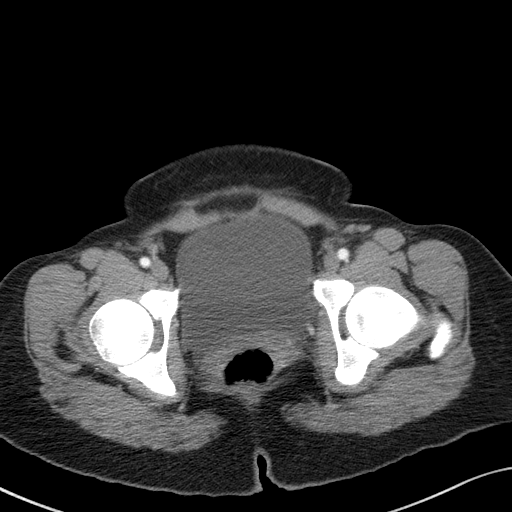
[im 19/91  soft-tissue]
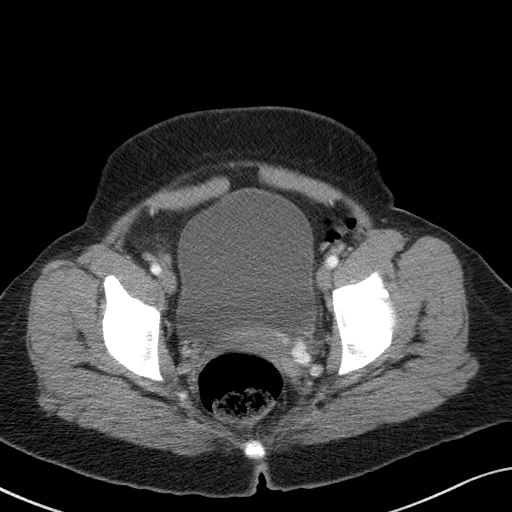
[im 28/91  soft-tissue]
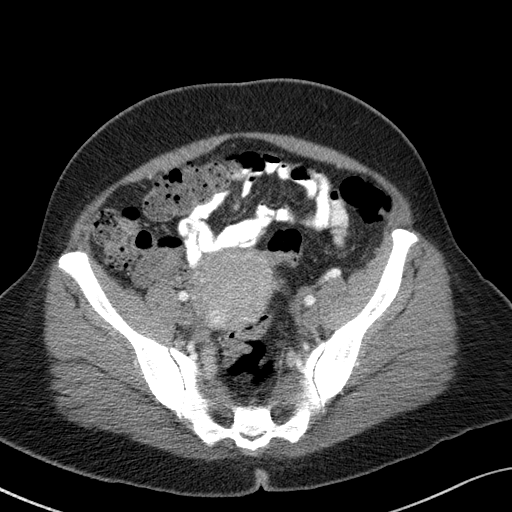
[im 32/91  soft-tissue]
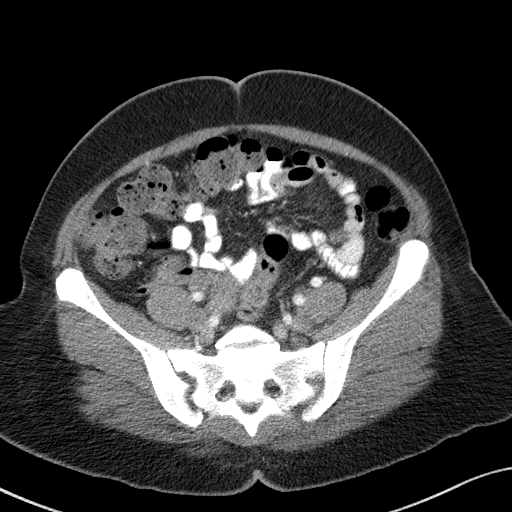
[im 41/91  soft-tissue]
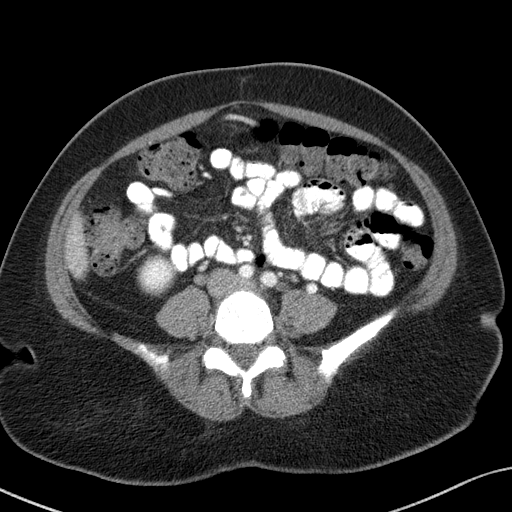
[im 46/91  soft-tissue]
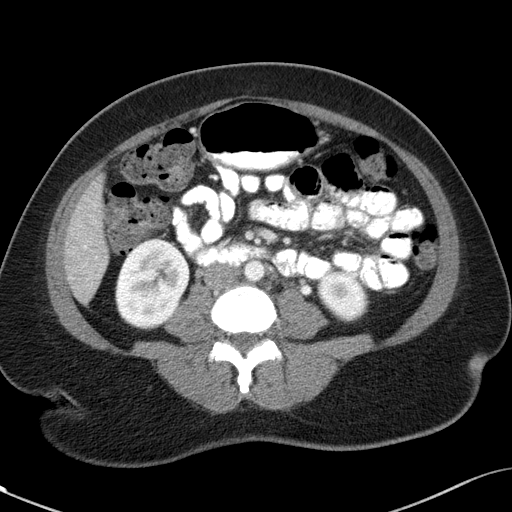
[im 50/91  soft-tissue]
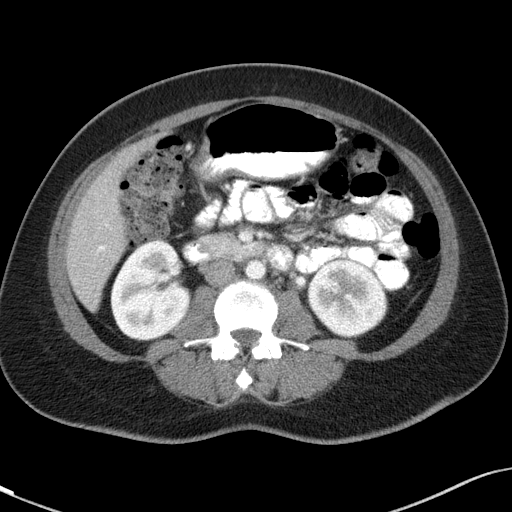
[im 59/91  soft-tissue]
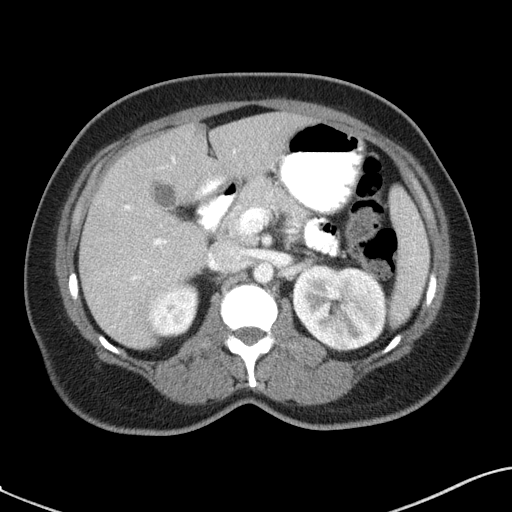
[im 59/91  bone]
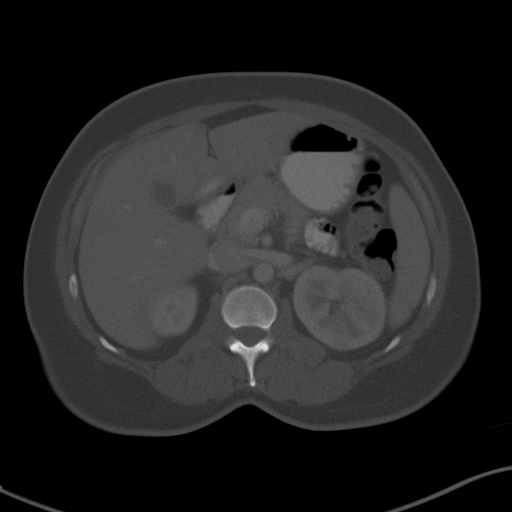
[im 64/91  soft-tissue]
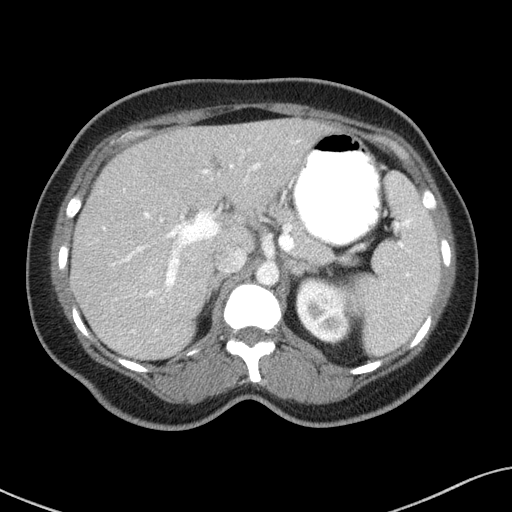
[im 73/91  soft-tissue]
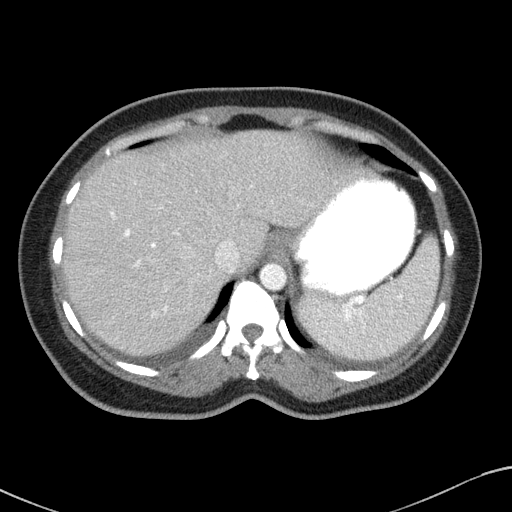
[im 77/91  soft-tissue]
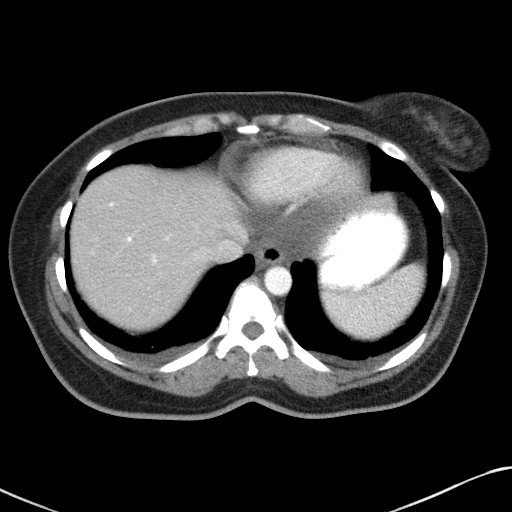
[im 86/91  soft-tissue]
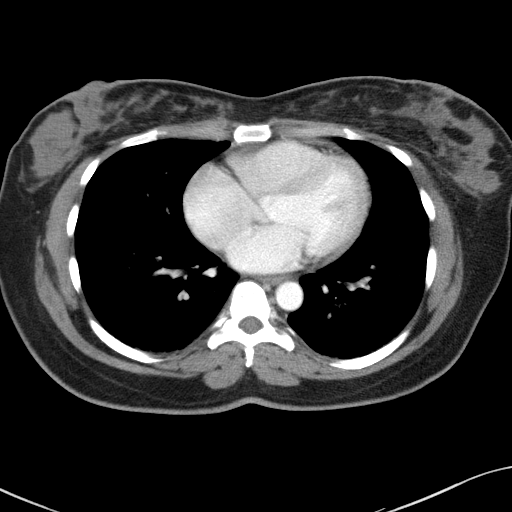

[Series 5: abd/pelvis 3.0 coronal · coronal · 0.73mm/px · 3 of 94 slices shown]
[im 32/94  soft-tissue]
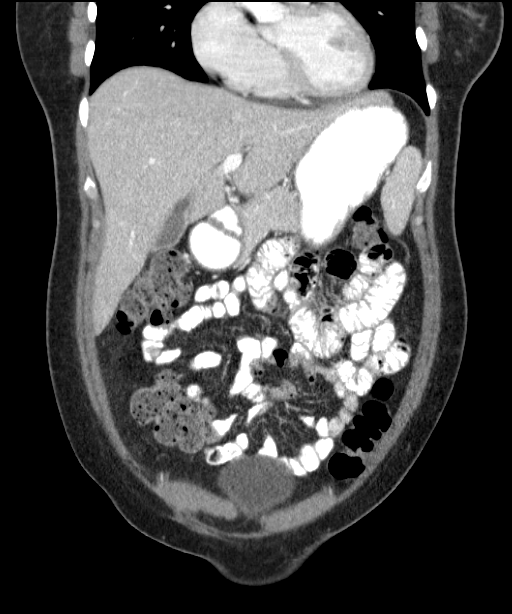
[im 42/94  soft-tissue]
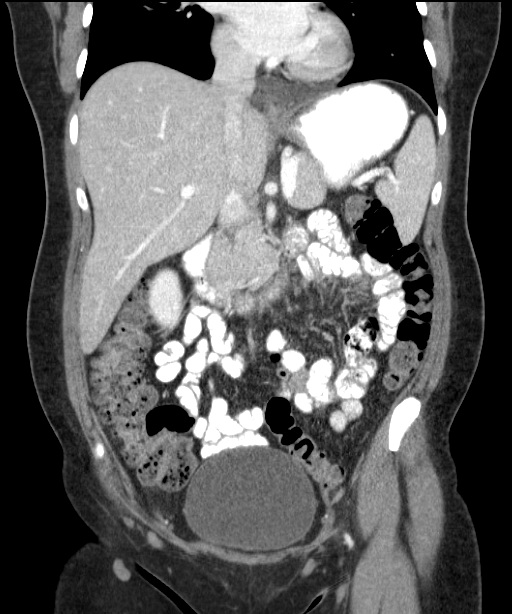
[im 52/94  soft-tissue]
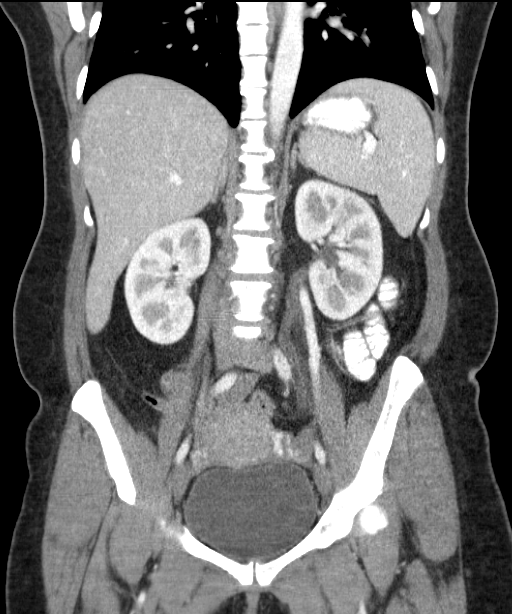

[16 of 46 positions shown; findings below may reference images not displayed]

FINDINGS: Trace bilateral pleural effusions are present.  Small
pericardial effusion inferiorly, best appreciated on the
parasagittal images. The liver, spleen, pancreas, and adrenal
glands appear unremarkable.

The gallstones seen on ultrasound are not readily apparent on
today's CT.  No biliary dilatation noted.

No pathologic retroperitoneal or porta hepatis adenopathy is
identified.

Appendix normal.

Borderline prominence of gas and stool in the colon, particularly
proximally.  Suspected 2.6 x 1.6 x 1.8 cm fibroid in the posterior
uterine body.  2.5 cm hypodensity associated with the left ovary,
likely a cyst.  No free pelvic fluid observed.

Patchy hypoenhancement in the left kidney upper pole raises
suspicion for inflammation, and is best appreciated on the delayed
images.
IMPRESSION: 1.  Hypoenhancement in the left kidney upper pole, especially
appreciable on delayed images, concerning for possible early
pyelonephritis.  No specific renal arterial abnormality is observed
to suggest predisposition for renal infarct.
2.  Trace bilateral pleural effusions and small pericardial
effusion, etiology uncertain.
3.  Suspected small posterior uterine body fibroid.

## 2016-09-27 ENCOUNTER — Other Ambulatory Visit: Payer: Self-pay | Admitting: Gastroenterology

## 2016-09-27 DIAGNOSIS — R1310 Dysphagia, unspecified: Secondary | ICD-10-CM

## 2016-09-27 NOTE — Progress Notes (Signed)
Whitney Tigue MD 

## 2016-10-07 ENCOUNTER — Ambulatory Visit
Admission: RE | Admit: 2016-10-07 | Discharge: 2016-10-07 | Disposition: A | Discharge status: Home / Self Care | Payer: BLUE CROSS/BLUE SHIELD | Source: Ambulatory Visit | Attending: Gastroenterology | Admitting: Gastroenterology

## 2016-10-07 DIAGNOSIS — R1310 Dysphagia, unspecified: Secondary | ICD-10-CM

## 2016-11-05 ENCOUNTER — Other Ambulatory Visit: Payer: Self-pay | Admitting: Obstetrics & Gynecology

## 2016-11-05 DIAGNOSIS — R1011 Right upper quadrant pain: Secondary | ICD-10-CM

## 2016-11-07 ENCOUNTER — Ambulatory Visit
Admission: RE | Admit: 2016-11-07 | Discharge: 2016-11-07 | Disposition: A | Discharge status: Home / Self Care | Payer: BLUE CROSS/BLUE SHIELD | Source: Ambulatory Visit | Attending: Obstetrics & Gynecology | Admitting: Obstetrics & Gynecology

## 2016-11-07 DIAGNOSIS — R1011 Right upper quadrant pain: Secondary | ICD-10-CM

## 2017-02-07 ENCOUNTER — Ambulatory Visit: Payer: Self-pay | Admitting: Rheumatology

## 2017-02-11 ENCOUNTER — Ambulatory Visit (INDEPENDENT_AMBULATORY_CARE_PROVIDER_SITE_OTHER): Payer: Self-pay

## 2017-02-11 ENCOUNTER — Encounter: Payer: Self-pay | Admitting: Rheumatology

## 2017-02-11 ENCOUNTER — Ambulatory Visit (INDEPENDENT_AMBULATORY_CARE_PROVIDER_SITE_OTHER): Payer: BLUE CROSS/BLUE SHIELD | Admitting: Rheumatology

## 2017-02-11 VITALS — BP 147/88 | HR 67 | Resp 14 | Ht 63.0 in | Wt 164.5 lb

## 2017-02-11 DIAGNOSIS — M170 Bilateral primary osteoarthritis of knee: Secondary | ICD-10-CM | POA: Diagnosis not present

## 2017-02-11 DIAGNOSIS — M25572 Pain in left ankle and joints of left foot: Secondary | ICD-10-CM

## 2017-02-11 DIAGNOSIS — M25562 Pain in left knee: Secondary | ICD-10-CM

## 2017-02-11 DIAGNOSIS — R030 Elevated blood-pressure reading, without diagnosis of hypertension: Secondary | ICD-10-CM | POA: Diagnosis not present

## 2017-02-11 DIAGNOSIS — M25571 Pain in right ankle and joints of right foot: Secondary | ICD-10-CM | POA: Diagnosis not present

## 2017-02-11 DIAGNOSIS — M79641 Pain in right hand: Secondary | ICD-10-CM | POA: Diagnosis not present

## 2017-02-11 DIAGNOSIS — M79642 Pain in left hand: Secondary | ICD-10-CM | POA: Diagnosis not present

## 2017-02-11 DIAGNOSIS — M25561 Pain in right knee: Secondary | ICD-10-CM

## 2017-02-11 MED ORDER — DICLOFENAC SODIUM 1 % TD GEL: TRANSDERMAL | 0 refills | Status: AC

## 2017-02-11 NOTE — Patient Instructions (Signed)
Natural anti-inflammatories  You can purchase these at [employer], [employer] or online.  . Turmeric (capsules)  . Ginger (ginger root or capsules)  . Omega 3 (Fish, flax seeds, chia seeds, walnuts, almonds)  . Tart cherry (dried or extract)   Patient should be under the care of a physician while taking these supplements. This may not be reproduced without the permission of Dr. Abigayle Wilinski.   Knee Exercises Ask your health care provider which exercises are safe for you. Do exercises exactly as told by your health care provider and adjust them as directed. It is normal to feel mild stretching, pulling, tightness, or discomfort as you do these exercises, but you should stop right away if you feel sudden pain or your pain gets worse.Do not begin these exercises until told by your health care provider. STRETCHING AND RANGE OF MOTION EXERCISES These exercises warm up your muscles and joints and improve the movement and flexibility of your knee. These exercises also help to relieve pain, numbness, and tingling. Exercise A: Knee Extension, Prone 1. Lie on your abdomen on a bed. 2. Place your left / right knee just beyond the edge of the surface so your knee is not on the bed. You can put a towel under your left / right thigh just above your knee for comfort. 3. Relax your leg muscles and allow gravity to straighten your knee. You should feel a stretch behind your left / right knee. 4. Hold this position for __________ seconds. 5. Scoot up so your knee is supported between repetitions. Repeat __________ times. Complete this stretch __________ times a day. Exercise B: Knee Flexion, Active  1. Lie on your back with both knees straight. If this causes back discomfort, bend your left / right knee so your foot is flat on the floor. 2. Slowly slide your left / right heel back toward your buttocks until you feel a gentle stretch in the front of your knee or thigh. 3. Hold this position  for __________ seconds. 4. Slowly slide your left / right heel back to the starting position. Repeat __________ times. Complete this exercise __________ times a day. Exercise C: Quadriceps, Prone  1. Lie on your abdomen on a firm surface, such as a bed or padded floor. 2. Bend your left / right knee and hold your ankle. If you cannot reach your ankle or pant leg, loop a belt around your foot and grab the belt instead. 3. Gently pull your heel toward your buttocks. Your knee should not slide out to the side. You should feel a stretch in the front of your thigh and knee. 4. Hold this position for __________ seconds. Repeat __________ times. Complete this stretch __________ times a day. Exercise D: Hamstring, Supine 1. Lie on your back. 2. Loop a belt or towel over the ball of your left / right foot. The ball of your foot is on the walking surface, right under your toes. 3. Straighten your left / right knee and slowly pull on the belt to raise your leg until you feel a gentle stretch behind your knee. ? Do not let your left / right knee bend while you do this. ? Keep your other leg flat on the floor. 4. Hold this position for __________ seconds. Repeat __________ times. Complete this stretch __________ times a day. STRENGTHENING EXERCISES These exercises build strength and endurance in your knee. Endurance is the ability to use your muscles for a long time, even after they get tired. Exercise   E: Quadriceps, Isometric  1. Lie on your back with your left / right leg extended and your other knee bent. Put a rolled towel or small pillow under your knee if told by your health care provider. 2. Slowly tense the muscles in the front of your left / right thigh. You should see your kneecap slide up toward your hip or see increased dimpling just above the knee. This motion will push the back of the knee toward the floor. 3. For __________ seconds, keep the muscle as tight as you can without increasing  your pain. 4. Relax the muscles slowly and completely. Repeat __________ times. Complete this exercise __________ times a day. Exercise F: Straight Leg Raises - Quadriceps 1. Lie on your back with your left / right leg extended and your other knee bent. 2. Tense the muscles in the front of your left / right thigh. You should see your kneecap slide up or see increased dimpling just above the knee. Your thigh may even shake a bit. 3. Keep these muscles tight as you raise your leg 4-6 inches (10-15 cm) off the floor. Do not let your knee bend. 4. Hold this position for __________ seconds. 5. Keep these muscles tense as you lower your leg. 6. Relax your muscles slowly and completely after each repetition. Repeat __________ times. Complete this exercise __________ times a day. Exercise G: Hamstring, Isometric 1. Lie on your back on a firm surface. 2. Bend your left / right knee approximately __________ degrees. 3. Dig your left / right heel into the surface as if you are trying to pull it toward your buttocks. Tighten the muscles in the back of your thighs to dig as hard as you can without increasing any pain. 4. Hold this position for __________ seconds. 5. Release the tension gradually and allow your muscles to relax completely for __________ seconds after each repetition. Repeat __________ times. Complete this exercise __________ times a day. Exercise H: Hamstring Curls  If told by your health care provider, do this exercise while wearing ankle weights. Begin with __________ weights. Then increase the weight by 1 lb (0.5 kg) increments. Do not wear ankle weights that are more than __________. 1. Lie on your abdomen with your legs straight. 2. Bend your left / right knee as far as you can without feeling pain. Keep your hips flat against the floor. 3. Hold this position for __________ seconds. 4. Slowly lower your leg to the starting position.  Repeat __________ times. Complete this exercise  __________ times a day. Exercise I: Squats (Quadriceps) 1. Stand in front of a table, with your feet and knees pointing straight ahead. You may rest your hands on the table for balance but not for support. 2. Slowly bend your knees and lower your hips like you are going to sit in a chair. ? Keep your weight over your heels, not over your toes. ? Keep your lower legs upright so they are parallel with the table legs. ? Do not let your hips go lower than your knees. ? Do not bend lower than told by your health care provider. ? If your knee pain increases, do not bend as low. 3. Hold the squat position for __________ seconds. 4. Slowly push with your legs to return to standing. Do not use your hands to pull yourself to standing. Repeat __________ times. Complete this exercise __________ times a day. Exercise J: Wall Slides (Quadriceps)  1. Lean your back against a smooth wall or door   while you walk your feet out 18-24 inches (46-61 cm) from it. 2. Place your feet hip-width apart. 3. Slowly slide down the wall or door until your knees bend __________ degrees. Keep your knees over your heels, not over your toes. Keep your knees in line with your hips. 4. Hold for __________ seconds. Repeat __________ times. Complete this exercise __________ times a day. Exercise K: Straight Leg Raises - Hip Abductors 1. Lie on your side with your left / right leg in the top position. Lie so your head, shoulder, knee, and hip line up. You may bend your bottom knee to help you keep your balance. 2. Roll your hips slightly forward so your hips are stacked directly over each other and your left / right knee is facing forward. 3. Leading with your heel, lift your top leg 4-6 inches (10-15 cm). You should feel the muscles in your outer hip lifting. ? Do not let your foot drift forward. ? Do not let your knee roll toward the ceiling. 4. Hold this position for __________ seconds. 5. Slowly return your leg to the starting  position. 6. Let your muscles relax completely after each repetition. Repeat __________ times. Complete this exercise __________ times a day. Exercise L: Straight Leg Raises - Hip Extensors 1. Lie on your abdomen on a firm surface. You can put a pillow under your hips if that is more comfortable. 2. Tense the muscles in your buttocks and lift your left / right leg about 4-6 inches (10-15 cm). Keep your knee straight as you lift your leg. 3. Hold this position for __________ seconds. 4. Slowly lower your leg to the starting position. 5. Let your leg relax completely after each repetition. Repeat __________ times. Complete this exercise __________ times a day. This information is not intended to replace advice given to you by your health care provider. Make sure you discuss any questions you have with your health care provider. Document Released: 01/09/2005 Document Revised: 11/20/2015 Document Reviewed: 01/01/2015 Elsevier Interactive Patient Education  2018 [employer]  

## 2017-02-11 NOTE — Progress Notes (Signed)
Office Visit Note  Patient: Whitney Chambers             Date of Birth: 19-Jan-1971           MRN: [id number]             PCP: Corine Shelter, PA-C Referring: Corine Shelter, PA-C Visit Date: 02/11/2017 Occupation: @GUAROCC @    Subjective:  Other (BIL knee and ankle pain )   History of Present Illness: Whitney Chambers is a 46 y.o. female has self-referred herself for evaluation of her symptoms. She states she's been having pain in her bilateral knee joints for the last 2 years. She has difficulty with flexing her left knee joint. She states she has difficulty sitting on the floor due to knee joint discomfort. She denies any knee joint swelling. She has noticed some pain and discomfort in her bilateral ankle joints and some intermittent swelling. She denies any hip joint discomfort. She denies any lower back pain. She works as a Haematologist. She has extra stress on her hands. She states she has discomfort in her bilateral CMC joints in her hands and she has also noticed decreased grip strength in her right hand. She denies any hand swelling.  Activities of Daily Living:  Patient reports morning stiffness for 2 minutes.   Patient Reports nocturnal pain.  Difficulty dressing/grooming: Denies Difficulty climbing stairs: Reports Difficulty getting out of chair: Reports Difficulty using hands for taps, buttons, cutlery, and/or writing: Denies   Review of Systems  Constitutional: Positive for fatigue. Negative for night sweats, weight gain, weight loss and weakness.  HENT: Negative for mouth sores, trouble swallowing, trouble swallowing, mouth dryness and nose dryness.   Eyes: Positive for dryness. Negative for pain, redness and visual disturbance.  Respiratory: Negative for cough, shortness of breath and difficulty breathing.   Cardiovascular: Positive for hypertension. Negative for chest pain, palpitations, irregular heartbeat and swelling in legs/feet.  Gastrointestinal: Negative for  blood in stool, constipation and diarrhea.  Endocrine: Negative for increased urination.  Genitourinary: Negative for vaginal dryness.  Musculoskeletal: Positive for arthralgias, joint pain and morning stiffness. Negative for joint swelling, myalgias, muscle weakness, muscle tenderness and myalgias.  Skin: Negative for color change, rash, hair loss, skin tightness, ulcers and sensitivity to sunlight.  Allergic/Immunologic: Negative for susceptible to infections.  Neurological: Negative for dizziness, memory loss and night sweats.  Hematological: Negative for swollen glands.  Psychiatric/Behavioral: Negative for depressed mood and sleep disturbance. The patient is not nervous/anxious.     PMFS History:  Patient Active Problem List   Diagnosis Date Noted  . Cholecystitis with cholelithiasis 12/06/2013  . Cholelithiasis with cholecystitis 10/27/2013  . Infection due to ESBL-producing Escherichia coli 09/02/2012  . UTI (urinary tract infection) 09/02/2012  . ALLERGIC RHINITIS, SEASONAL 07/13/2007  . CHEST PAIN 12/23/2006  . HYPOTHYROIDISM 12/08/2006  . GESTATIONAL DIABETES 12/08/2006  . NECK PAIN, CHRONIC 12/08/2006    Past Medical History:  Diagnosis Date  . Fibroids   . Gallstones   . GERD (gastroesophageal reflux disease)   . Headache(784.0)   . Low iron   . Pyelonephritis 09/02/2012  . Thyroid disease    hypothyroidism    Family History  Problem Relation Age of Onset  . Stroke Mother   . Heart attack Father   . Heart Problems Sister   . Hypertension Brother   . Healthy Daughter   . Healthy Son    Past Surgical History:  Procedure Laterality Date  . CESAREAN SECTION    .  CHOLECYSTECTOMY N/A 12/06/2013   Procedure: LAPAROSCOPIC CHOLECYSTECTOMY WITH INTRAOPERATIVE CHOLANGIOGRAM;  Surgeon: Armandina Gemma, MD;  Location: [facility];  Service: General;  Laterality: N/A;   Social History   Social History Narrative  . Not on file     Objective: Vital Signs: BP (!) 147/88 (BP  Location: Left Arm, Patient Position: Sitting, Cuff Size: Normal)   Pulse 67   Resp 14   Ht 5\' 3"  (1.6 m)   Wt 164 lb 8 oz (74.6 kg)   BMI 29.14 kg/m    Physical Exam  Constitutional: She is oriented to person, place, and time. She appears well-developed and well-nourished.  HENT:  Head: Normocephalic and atraumatic.  Eyes: Conjunctivae and EOM are normal.  Neck: Normal range of motion.  Cardiovascular: Normal rate, regular rhythm, normal heart sounds and intact distal pulses.  Pulmonary/Chest: Effort normal and breath sounds normal.  Abdominal: Soft. Bowel sounds are normal.  Lymphadenopathy:    She has no cervical adenopathy.  Neurological: She is alert and oriented to person, place, and time.  Skin: Skin is warm and dry. Capillary refill takes less than 2 seconds.  Psychiatric: She has a normal mood and affect. Her behavior is normal.  Nursing note and vitals reviewed.    Musculoskeletal Exam: C-spine and thoracic lumbar spine good range of motion. Shoulder joints elbow joints wrist joints with good range of motion. She has mild thickening of her DIP joints. Hip joints are good range of motion with no discomfort. She has crepitus in her bilateral knee joints without any warmth swelling or effusion. She has some tenderness over the medial aspect of her bilateral knee joints. Ankle joints are good range of motion without any warmth swelling or effusion. There was no swelling or MTP is a PIPs DIPs.  CDAI Exam: No CDAI exam completed.    Investigation: No additional findings.   Imaging: Xr Ankle 2 Views Left  Result Date: 02/11/2017 No tibiotalar or subtalar joint space narrowing was noted. Small posterior calcaneal spur was noted. Impression: Unremarkable x-ray of the ankle joint.  Xr Ankle 2 Views Right  Result Date: 02/11/2017 No tibiotalar or subtalar joint space narrowing was noted. Small posterior calcaneal spur was noted. Impression: Unremarkable x-ray of the ankle  joint.  Xr Knee 3 View Left  Result Date: 02/11/2017 Mild medial compartment narrowing was noted. Mild patellofemoral narrowing was noted. No chondrocalcinosis was noted. 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  Xr Knee 3 View Right  Result Date: 02/11/2017 Mild medial compartment narrowing was noted. Mild patellofemoral narrowing was noted. No chondrocalcinosis was noted. 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   Speciality Comments: No specialty comments available.    Procedures:  No procedures performed Allergies: Patient has no known allergies.   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 Detailed counseling was provided. Weight loss diet and exercise was discussed. I will also refer her to physical therapy. She gives history of significant morning stiffness and discomfort. I will obtain following labs today.  Uric acid, Rheumatoid factor, Cyclic citrul peptide antibody, IgG, ANA, Angiotensin converting enzyme, Sedimentation rate. Have given her prescription for Voltaren gel which can be used topically. Indications side effects contraindications were discussed. A list of natural anti-inflammatories  was given. I will also refer her to physical therapy for her knee joint and muscle strengthening. A handout on knee joint exercises was given.  Bilateral ankle pain, unspecified chronicity - Plan: XR Ankle 2 Views Left, XR Ankle 2 Views Right. The x-rays were unremarkable. She had no significant swelling on examination.  Pain in both hands: She has some DIP changes consistent with osteoarthritis. She also had CMC discomfort. Joint protection and muscle strengthening was  discussed.  Elevated blood pressure: I will advised patient to monitor blood pressure closely. She states she gets headaches frequently with elevated blood pressure. She's been seeing Dr. Einar Gip. I've advised her to discuss possible antihypertensive therapy with him.     Orders: Orders Placed This Encounter  Procedures  . XR KNEE 3 VIEW RIGHT  . XR KNEE 3 VIEW LEFT  . XR Ankle 2 Views Left  . XR Ankle 2 Views Right  . Uric acid  . Rheumatoid factor  . Cyclic citrul peptide antibody, IgG  . ANA  . Angiotensin converting enzyme  . Sedimentation rate  . Ambulatory referral to Physical Therapy   Meds ordered this encounter  Medications  . diclofenac sodium (VOLTAREN) 1 % GEL    Sig: Apply 3 gm to 3 large joints up to 3 times a day.Dispense 3 tubes with 3 refills.    Dispense:  3 Tube    Refill:  0    Face-to-face time spent with patient was 45 minutes. Greater than 50% of time was spent in counseling and coordination of care.  Follow-Up Instructions: Return for Osteoarthritis.   Bo Merino, MD  Note - This record has been created using Editor, commissioning.  Chart creation errors have been sought, but may not always  have been located. Such creation errors do not reflect on  the standard of medical care.

## 2017-02-12 LAB — SEDIMENTATION RATE: Sed Rate: 25 mm/h — ABNORMAL HIGH (ref 0–20)

## 2017-02-12 LAB — ANGIOTENSIN CONVERTING ENZYME: Angiotensin-Converting Enzyme: 30 U/L (ref 9–67)

## 2017-02-12 LAB — CYCLIC CITRUL PEPTIDE ANTIBODY, IGG

## 2017-02-12 LAB — URIC ACID: Uric Acid, Serum: 3.6 mg/dL (ref 2.5–7.0)

## 2017-02-12 LAB — RHEUMATOID FACTOR: Rhuematoid fact SerPl-aCnc: <14 IU/mL (ref <14)

## 2017-02-12 LAB — ANA: Anti Nuclear Antibody(ANA): NEGATIVE

## 2017-02-12 NOTE — Progress Notes (Signed)
ESR mildly elevated. rest of the labs normal.

## 2017-02-13 ENCOUNTER — Telehealth: Payer: Self-pay | Admitting: *Deleted

## 2017-02-13 NOTE — Telephone Encounter (Signed)
Please schedule ultrasound of her foot to evaluate.

## 2017-02-13 NOTE — Telephone Encounter (Signed)
Patient advised sed rate was mildly elevated and the rest of her labs were normal. Patient states she is having pain and swelling in her left foot. Patient states she has a "bone popping up". Patient denies any injury to her foot. Please advise.

## 2017-02-14 ENCOUNTER — Telehealth: Payer: Self-pay

## 2017-02-14 NOTE — Telephone Encounter (Signed)
Patient scheduled for 02/19/17 for ultrasound.

## 2017-02-14 NOTE — Telephone Encounter (Signed)
Received a prior authorization request for Voltaren Gel from [facility]. Authorization on Cover My Meds asks if patient has has at least a 1 week trial of a therapeutic dose of an oral NSAID or has experienced intolerable side effects to oral NSAIDs. Called patient to verify. Had to leave a message for pt to call back. Will update once we have a response.   Annayah Worthley, [place], CPhT 10:28 AM

## 2017-02-14 NOTE — Telephone Encounter (Signed)
Authorization has been submitted the with following information through cover my meds.   Received a confirmation from [employer] regarding a prior authorization approval for Voltaren Gel from 02/14/2017  to 03/10/2038.   Reference number: None Phone number: None  Will send document to scan center once received.  Will contact patient to update.   Marwah Disbro, [place], CPhT 2:59 PM

## 2017-02-14 NOTE — Telephone Encounter (Signed)
Patient returned your call. Per patient she has tried Aleve, Motrin, and Tylenol with no relief.

## 2017-02-19 ENCOUNTER — Inpatient Hospital Stay (INDEPENDENT_AMBULATORY_CARE_PROVIDER_SITE_OTHER): Payer: Self-pay

## 2017-02-19 ENCOUNTER — Ambulatory Visit: Payer: BLUE CROSS/BLUE SHIELD | Admitting: Rheumatology

## 2017-02-19 DIAGNOSIS — M79672 Pain in left foot: Secondary | ICD-10-CM | POA: Diagnosis not present

## 2017-02-19 DIAGNOSIS — M79671 Pain in right foot: Secondary | ICD-10-CM

## 2017-02-20 NOTE — Progress Notes (Signed)
Office Visit Note  Patient: Whitney Chambers             Date of Birth: June 02, 1970           MRN: [id number]             PCP: Corine Shelter, PA-C Referring: Corine Shelter, PA-C Visit Date: 02/24/2017 Occupation: _0 @    Subjective:  Pain in multiple joints.   History of Present Illness: Whitney Chambers is a 46 y.o. female returned today for follow-up after her ultrasound examination. She continues to have pain and discomfort in multiple joints. She complains of pain in her bilateral wrist, bilateral hands, bilateral knees and bilateral ankles. She continues to have swelling in her bilateral ankles more so in her left ankle than right. She also gives history of intermittent swelling in her bilateral hands.  Activities of Daily Living:  Patient reports morning stiffness for 1 minute.   Patient Reports nocturnal pain.  Difficulty dressing/grooming: Denies Difficulty climbing stairs: Reports Difficulty getting out of chair: Reports Difficulty using hands for taps, buttons, cutlery, and/or writing: Denies   Review of Systems  Constitutional: Positive for fatigue. Negative for night sweats, weight gain, weight loss and weakness.  HENT: Negative for mouth sores, trouble swallowing, trouble swallowing, mouth dryness and nose dryness.   Eyes: Positive for dryness. Negative for pain, redness and visual disturbance.  Respiratory: Negative for cough, shortness of breath and difficulty breathing.   Cardiovascular: Positive for hypertension. Negative for chest pain, palpitations, irregular heartbeat and swelling in legs/feet.  Gastrointestinal: Negative for blood in stool, constipation and diarrhea.  Endocrine: Negative for increased urination.  Genitourinary: Negative for vaginal dryness.  Musculoskeletal: Positive for arthralgias, joint pain, joint swelling, myalgias, morning stiffness and myalgias. Negative for muscle weakness and muscle tenderness.  Skin: Negative for color change,  rash, hair loss, skin tightness, ulcers and sensitivity to sunlight.  Allergic/Immunologic: Negative for susceptible to infections.  Neurological: Negative for dizziness, memory loss and night sweats.  Hematological: Negative for swollen glands.  Psychiatric/Behavioral: Positive for depressed mood. Negative for sleep disturbance. The patient is nervous/anxious.     PMFS History:  Patient Active Problem List   Diagnosis Date Noted  . Cholecystitis with cholelithiasis 12/06/2013  . Cholelithiasis with cholecystitis 10/27/2013  . Infection due to ESBL-producing Escherichia coli 09/02/2012  . UTI (urinary tract infection) 09/02/2012  . ALLERGIC RHINITIS, SEASONAL 07/13/2007  . CHEST PAIN 12/23/2006  . HYPOTHYROIDISM 12/08/2006  . GESTATIONAL DIABETES 12/08/2006  . NECK PAIN, CHRONIC 12/08/2006    Past Medical History:  Diagnosis Date  . Fibroids   . Gallstones   . GERD (gastroesophageal reflux disease)   . Headache(784.0)   . Low iron   . Pyelonephritis 09/02/2012  . Thyroid disease    hypothyroidism    Family History  Problem Relation Age of Onset  . Stroke Mother   . Heart attack Father   . Heart Problems Sister   . Hypertension Brother   . Healthy Daughter   . Healthy Son    Past Surgical History:  Procedure Laterality Date  . CESAREAN SECTION    . CHOLECYSTECTOMY N/A 12/06/2013   Procedure: LAPAROSCOPIC CHOLECYSTECTOMY WITH INTRAOPERATIVE CHOLANGIOGRAM;  Surgeon: Armandina Gemma, MD;  Location: [facility];  Service: General;  Laterality: N/A;   Social History   Social History Narrative  . Not on file     Objective: Vital Signs: BP 140/85 (BP Location: Left Arm, Patient Position: Sitting, Cuff Size: Normal)   Pulse 64  Resp 16   Ht _0  (1.6 m)   Wt 164 lb (74.4 kg)   BMI 29.05 kg/m    Physical Exam  Constitutional: She is oriented to person, place, and time. She appears well-developed and well-nourished.  HENT:  Head: Normocephalic and atraumatic.  Eyes:  Conjunctivae and EOM are normal.  Neck: Normal range of motion.  Cardiovascular: Normal rate, regular rhythm, normal heart sounds and intact distal pulses.  Pulmonary/Chest: Effort normal and breath sounds normal.  Abdominal: Soft. Bowel sounds are normal.  Lymphadenopathy:    She has no cervical adenopathy.  Neurological: She is alert and oriented to person, place, and time.  Skin: Skin is warm and dry. Capillary refill takes less than 2 seconds.  Psychiatric: She has a normal mood and affect. Her behavior is normal.  Nursing note and vitals reviewed.    Musculoskeletal Exam: C-spine and thoracic lumbar spine good range of motion. Shoulder joints elbow joints wrist joint MCPs PIPs DIPs with good range of motion. She is tenderness over her bilateral CMC joints more so on the left side. No synovitis was noted over her MCP joints. She is painful range of motion of bilateral knee joints. She is tenderness over bilateral ankle joints and of bilateral MTP joints. Warmth and swelling was noted over her left ankle joint. Tenderness was noted over right ankle joint.  CDAI Exam: CDAI Homunculus Exam:   Tenderness:  LUE: carpometacarpal RLE: tibiofemoral and tibiotalar LLE: tibiofemoral and tibiotalar  Swelling:  RLE: tibiotalar LLE: tibiotalar  Joint Counts:  CDAI Tender Joint count: 2 CDAI Swollen Joint count: 0  Global Assessments:  Patient Global Assessment: 8 Provider Global Assessment: 8  CDAI Calculated Score: 18    Investigation: No additional findings. Component     Latest Ref Rng & Units 02/11/2017  Uric Acid, Serum     2.5 - 7.0 mg/dL 3.6  RA Latex Turbid.     <33 IU/mL <54  Cyclic Citrullin Peptide Ab     UNITS <16  Anit Nuclear Antibody(ANA)     NEGATIVE NEGATIVE  Angiotensin-Converting Enzyme     9 - 67 U/L 30  Sed Rate     0 - 20 mm/h 25 (H)    Imaging: [place] Extrem Low Left Ltd  Result Date: 02/19/2017 Ultrasound examination of bilateral hands was  performed per EULAR recommendations. Using 12 MHz transducer, grayscale and power Doppler left first, second, third, and fifth MTP joints both dorsal and plantar aspect, left tibial talar joint and Atrovent malleolus were evaluated to look for synovitis or tenosynovitis. The findings were there was synovitis in left first second and fourth MTP joints. Left tibiotalar joint and fibulotalar joint on ultrasound examination. Impression: These signs are consistent with inflammatory arthritis.   Xr Ankle 2 Views Left  Result Date: 02/11/2017 No tibiotalar or subtalar joint space narrowing was noted. Small posterior calcaneal spur was noted. Impression: Unremarkable x-ray of the ankle joint.  Xr Ankle 2 Views Right  Result Date: 02/11/2017 No tibiotalar or subtalar joint space narrowing was noted. Small posterior calcaneal spur was noted. Impression: Unremarkable x-ray of the ankle joint.  Xr Foot 2 Views Left  Result Date: 02/24/2017 No MTP PIP/DIP narrowing was noted. No intertarsal joint space narrowing was noted. A small posterior calcaneal spur was noted. Impression: Normal x-ray of the foot.  Xr Foot 2 Views Right  Result Date: 02/24/2017 No MTP PIP/DIP narrowing was noted. No intertarsal joint space narrowing was noted. A small posterior calcaneal spur  was noted. Impression: Normal x-ray of the foot.  Xr Hand 2 View Left  Result Date: 02/24/2017 No MCP PIP/DIP or intercarpal joint space narrowing was noted. No erosive changes were noted. Impression: Normal x-ray of the hand.  Xr Hand 2 View Right  Result Date: 02/24/2017 No MCP PIP/DIP or intercarpal joint space narrowing was noted. No erosive changes were noted. Impression: Normal x-ray of the hand.  Xr Knee 3 View Left  Result Date: 02/11/2017 Mild medial compartment narrowing was noted. Mild patellofemoral narrowing was noted. No chondrocalcinosis was noted. 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  Xr Knee 3 View Right  Result Date: 02/11/2017 Mild medial compartment narrowing was noted. Mild patellofemoral narrowing was noted. No chondrocalcinosis was noted. 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   Speciality Comments: No specialty comments available.    Procedures:  No procedures performed Allergies: Patient has no known allergies.   Assessment / Plan:     Visit Diagnoses: Inflammatory arthritis - Seronegative -all autoimmune workup has been negative. She gives history of intermittent swelling in her knees and ankles. She had positive synovitis in her left ankle joint in her MTPs during the ultrasound examination. We had detailed discussion regarding different treatment options. She states she's taken NSAIDs for several months in the past and had no response. She also gets GI side effects from NSAIDs. Different treatment options and their side effects were discussed. I discussed the option of trying hydroxychloroquine or sulfasalazine. After reviewing indications side effects contraindications she wants to proceed with hydroxychloroquine. I also offered prednisone taper which she declined. Plan: HLA-B27 antigen  Patient was counseled on the purpose, proper use, and adverse effects of hydroxychloroquine including nausea/diarrhea, skin rash, headaches, and sun sensitivity.  Discussed importance of annual eye exams while on hydroxychloroquine to monitor to ocular toxicity and discussed importance of frequent laboratory monitoring.  Provided patient with eye exam form for baseline ophthalmologic exam.  Provided patient with educational materials on hydroxychloroquine and answered all questions.  Patient consented to hydroxychloroquine.  Will upload consent in the media tab.  An eye exam form was given for her to have baseline eye exam.  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 I do not see any swelling on examination today. Although she gives history of intermittent swelling.  Bilateral ankle pain, unspecified chronicity: She continues to have pain and discomfort in her bilateral ankles she has swelling in her ankle joints and also some synovitis.  Pain in both hands -pain is mostly over her CMC joints. These findings are consistent with osteoarthritis. A prescription for left CMC brace was given. Plan: XR Hand 2 View Right, XR Hand 2 View Left  History of hypertension - her blood pressure was elevated today. She will be seeing Dr. Einar Gip for follow-up. She states she's been having headaches every day and would like to start on blood pressure medication. Per her request of given her prescription for amlodipine 5 mg by mouth daily with 2 refills and side effects were reviewed. I've also advised her to discuss this further with Dr. Einar Gip.  History of hypothyroidism  Pain in both feet - Plan: XR Foot 2 Views Right, XR Foot 2 Views Left  High risk medication use -I will obtain following baseline labs today. Plan: CBC with Differential/Platelet, COMPLETE METABOLIC PANEL WITH GFR, Urinalysis, Routine w reflex microscopic, Glucose 6 phosphate dehydrogenase,  Serum protein electrophoresis with reflex, IgG, IgA, IgM, Hepatitis B core antibody, IgM, Hepatitis B surface antigen, Hepatitis C antibody, Quantiferon tb gold assay (blood)    Orders: Orders Placed This Encounter  Procedures  . XR Foot 2 Views Right  . XR Foot 2 Views Left  . XR Hand 2 View Right  . XR Hand 2 View Left  . CBC with Differential/Platelet  . COMPLETE METABOLIC PANEL WITH GFR  . Urinalysis, Routine w reflex microscopic  . HLA-B27 antigen  . Glucose 6 phosphate dehydrogenase  . Serum protein electrophoresis with reflex  . IgG, IgA, IgM  . Hepatitis B core antibody, IgM  . Hepatitis B surface antigen  . Hepatitis C antibody  .  Quantiferon tb gold assay (blood)  . CBC with Differential/Platelet  . COMPLETE METABOLIC PANEL WITH GFR   Meds ordered this encounter  Medications  . amLODipine (NORVASC) 5 MG tablet    Sig: Take 1 tablet (5 mg total) by mouth daily.    Dispense:  30 tablet    Refill:  2  . hydroxychloroquine (PLAQUENIL) 200 MG tablet    Sig: 1 tablet by mouth twice a day Monday through Friday    Dispense:  40 tablet    Refill:  2    Face-to-face time spent with patient was 30 minutes. Greater than 50% of time was spent in counseling and coordination of care.  Follow-Up Instructions: Return in about 3 months (around 05/25/2017) for Inflammatory arthritis.   Bo Merino, MD  Note - This record has been created using Editor, commissioning.  Chart creation errors have been sought, but may not always  have been located. Such creation errors do not reflect on  the standard of medical care.

## 2017-02-24 ENCOUNTER — Ambulatory Visit (INDEPENDENT_AMBULATORY_CARE_PROVIDER_SITE_OTHER): Payer: Self-pay

## 2017-02-24 ENCOUNTER — Encounter: Payer: Self-pay | Admitting: Rheumatology

## 2017-02-24 ENCOUNTER — Ambulatory Visit: Payer: BLUE CROSS/BLUE SHIELD | Admitting: Rheumatology

## 2017-02-24 VITALS — BP 140/85 | HR 64 | Resp 16 | Ht 63.0 in | Wt 164.0 lb

## 2017-02-24 DIAGNOSIS — M1990 Unspecified osteoarthritis, unspecified site: Secondary | ICD-10-CM

## 2017-02-24 DIAGNOSIS — M25562 Pain in left knee: Secondary | ICD-10-CM | POA: Diagnosis not present

## 2017-02-24 DIAGNOSIS — M25561 Pain in right knee: Secondary | ICD-10-CM | POA: Diagnosis not present

## 2017-02-24 DIAGNOSIS — Z79899 Other long term (current) drug therapy: Secondary | ICD-10-CM

## 2017-02-24 DIAGNOSIS — M79672 Pain in left foot: Secondary | ICD-10-CM

## 2017-02-24 DIAGNOSIS — M79671 Pain in right foot: Secondary | ICD-10-CM

## 2017-02-24 DIAGNOSIS — Z8639 Personal history of other endocrine, nutritional and metabolic disease: Secondary | ICD-10-CM

## 2017-02-24 DIAGNOSIS — Z8679 Personal history of other diseases of the circulatory system: Secondary | ICD-10-CM

## 2017-02-24 DIAGNOSIS — M25571 Pain in right ankle and joints of right foot: Secondary | ICD-10-CM

## 2017-02-24 DIAGNOSIS — M25572 Pain in left ankle and joints of left foot: Secondary | ICD-10-CM

## 2017-02-24 DIAGNOSIS — M79641 Pain in right hand: Secondary | ICD-10-CM

## 2017-02-24 DIAGNOSIS — M79642 Pain in left hand: Secondary | ICD-10-CM

## 2017-02-24 MED ORDER — AMLODIPINE BESYLATE 5 MG PO TABS: 5.0000 mg | ORAL_TABLET | Freq: Every day | ORAL | 2 refills | Status: AC

## 2017-02-24 MED ORDER — HYDROXYCHLOROQUINE SULFATE 200 MG PO TABS: ORAL_TABLET | ORAL | 2 refills | Status: AC

## 2017-02-24 NOTE — Patient Instructions (Addendum)
Standing Labs We placed an order today for your standing lab work.    Please come back and get your standing labs in 1 month after starting the medicine then 3 months  We have open lab Monday through Friday from 8:30-11:30 AM and 1:30-4 PM at the office of Dr. Bo Merino.   The office is located at [address] [place], [place], [place] [address] No appointment is necessary.   Labs are drawn by [facility].  You may receive a bill from [facility] for your lab work. If you have any questions regarding directions or hours of operation,  please call [phone].     Hydroxychloroquine tablets What is this medicine? HYDROXYCHLOROQUINE (hye drox ee KLOR oh kwin) is used to treat rheumatoid arthritis and systemic lupus erythematosus. It is also used to treat malaria. This medicine may be used for other purposes; ask your health care provider or pharmacist if you have questions. COMMON BRAND NAME(S): Plaquenil, Quineprox What should I tell my health care provider before I take this medicine? They need to know if you have any of these conditions: -diabetes -eye disease, vision problems -G6PD deficiency -history of blood diseases -history of irregular heartbeat -if you often drink alcohol -kidney disease -liver disease -porphyria -psoriasis -seizures -an unusual or allergic reaction to chloroquine, hydroxychloroquine, other medicines, foods, dyes, or preservatives -pregnant or trying to get pregnant -breast-feeding How should I use this medicine? Take this medicine by mouth with a glass of water. Follow the directions on the prescription label. Avoid taking antacids within 4 hours of taking this medicine. It is best to separate these medicines by at least 4 hours. Do not cut, crush or chew this medicine. You can take it with or without food. If it upsets your stomach, take it with food. Take your medicine at regular intervals. Do not take your medicine more often than directed. Take  all of your medicine as directed even if you think you are better. Do not skip doses or stop your medicine early. Talk to your pediatrician regarding the use of this medicine in children. While this drug may be prescribed for selected conditions, precautions do apply. Overdosage: If you think you have taken too much of this medicine contact a poison control center or emergency room at once. NOTE: This medicine is only for you. Do not share this medicine with others. What if I miss a dose? If you miss a dose, take it as soon as you can. If it is almost time for your next dose, take only that dose. Do not take double or extra doses. What may interact with this medicine? Do not take this medicine with any of the following medications: -cisapride -dofetilide -dronedarone -live virus vaccines -penicillamine -pimozide -thioridazine -ziprasidone This medicine may also interact with the following medications: -ampicillin -antacids -cimetidine -cyclosporine -digoxin -medicines for diabetes, like insulin, glipizide, glyburide -medicines for seizures like carbamazepine, phenobarbital, phenytoin -mefloquine -methotrexate -other medicines that prolong the QT interval (cause an abnormal heart rhythm) -praziquantel This list may not describe all possible interactions. Give your health care provider a list of all the medicines, herbs, non-prescription drugs, or dietary supplements you use. Also tell them if you smoke, drink alcohol, or use illegal drugs. Some items may interact with your medicine. What should I watch for while using this medicine? Tell your doctor or healthcare professional if your symptoms do not start to get better or if they get worse. Avoid taking antacids within 4 hours of taking this medicine. It  is best to separate these medicines by at least 4 hours. Tell your doctor or health care professional right away if you have any change in your eyesight. Your vision and blood may be  tested before and during use of this medicine. This medicine can make you more sensitive to the sun. Keep out of the sun. If you cannot avoid being in the sun, wear protective clothing and use sunscreen. Do not use sun lamps or tanning beds/booths. What side effects may I notice from receiving this medicine? Side effects that you should report to your doctor or health care professional as soon as possible: -allergic reactions like skin rash, itching or hives, swelling of the face, lips, or tongue -changes in vision -decreased hearing or ringing of the ears -redness, blistering, peeling or loosening of the skin, including inside the mouth -seizures -sensitivity to light -signs and symptoms of a dangerous change in heartbeat or heart rhythm like chest pain; dizziness; fast or irregular heartbeat; palpitations; feeling faint or lightheaded, falls; breathing problems -signs and symptoms of liver injury like dark yellow or brown urine; general ill feeling or flu-like symptoms; light-colored stools; loss of appetite; nausea; right upper belly pain; unusually weak or tired; yellowing of the eyes or skin -signs and symptoms of low blood sugar such as feeling anxious; confusion; dizziness; increased hunger; unusually weak or tired; sweating; shakiness; cold; irritable; headache; blurred vision; fast heartbeat; loss of consciousness -uncontrollable head, mouth, neck, arm, or leg movements Side effects that usually do not require medical attention (report to your doctor or health care professional if they continue or are bothersome): -anxious -diarrhea -dizziness -hair loss -headache -irritable -loss of appetite -nausea, vomiting -stomach pain This list may not describe all possible side effects. Call your doctor for medical advice about side effects. You may report side effects to FDA at 1-800-FDA-1088. Where should I keep my medicine? Keep out of the reach of children. In children, this medicine can  cause overdose with small doses. Store at room temperature between 15 and 30 degrees C (59 and 86 degrees F). Protect from moisture and light. Throw away any unused medicine after the expiration date. NOTE: This sheet is a summary. It may not cover all possible information. If you have questions about this medicine, talk to your doctor, pharmacist, or health care provider.  2018 [employer]vier/Gold Standard (2015-10-11 14:16:15)

## 2017-02-25 NOTE — Progress Notes (Signed)
Some of the labs are still pending. It is okay to call and Plaquenil 200 mg by mouth twice a day Monday through Friday total 30 day supply with 3 refills. She will need labs in 1 month then every 3 months to monitor for drug toxicity. If labs are stable we can check them every 5 months. She should also get a baseline eye exam.

## 2017-02-28 LAB — URINALYSIS, ROUTINE W REFLEX MICROSCOPIC
BACTERIA UA: NONE SEEN /HPF
Bilirubin Urine: NEGATIVE
Glucose, UA: NEGATIVE
Hyaline Cast: NONE SEEN /LPF
Ketones, ur: NEGATIVE
Leukocytes, UA: NEGATIVE
Nitrite: NEGATIVE
Protein, ur: NEGATIVE
RBC / HPF: NONE SEEN /HPF (ref 0–2)
SPECIFIC GRAVITY, URINE: 1.015 (ref 1.001–1.03)
SQUAMOUS EPITHELIAL / LPF: NONE SEEN /HPF (ref <=5)

## 2017-02-28 LAB — HLA-B27 ANTIGEN: HLA-B27 Antigen: NEGATIVE

## 2017-02-28 LAB — COMPLETE METABOLIC PANEL WITH GFR
AG Ratio: 1.7 (calc) (ref 1.0–2.5)
ALT: 21 U/L (ref 6–29)
AST: 18 U/L (ref 10–35)
Albumin: 4.7 g/dL (ref 3.6–5.1)
Alkaline phosphatase (APISO): 63 U/L (ref 33–115)
BILIRUBIN TOTAL: 0.5 mg/dL (ref 0.2–1.2)
BUN: 10 mg/dL (ref 7–25)
CALCIUM: 9.4 mg/dL (ref 8.6–10.2)
CHLORIDE: 105 mmol/L (ref 98–110)
CO2: 29 mmol/L (ref 20–32)
Creat: 0.59 mg/dL (ref 0.50–1.10)
GFR, EST NON AFRICAN AMERICAN: 110 mL/min/{1.73_m2} (ref >=60)
GFR, Est African American: 127 mL/min/{1.73_m2} (ref >=60)
GLOBULIN: 2.8 g/dL (ref 1.9–3.7)
Glucose, Bld: 100 mg/dL — ABNORMAL HIGH (ref 65–99)
Potassium: 4.5 mmol/L (ref 3.5–5.3)
Sodium: 139 mmol/L (ref 135–146)
Total Protein: 7.5 g/dL (ref 6.1–8.1)

## 2017-02-28 LAB — CBC WITH DIFFERENTIAL/PLATELET
BASOS PCT: 0.6 %
Basophils Absolute: 32 cells/uL (ref 0–200)
Eosinophils Absolute: 108 cells/uL (ref 15–500)
Eosinophils Relative: 2 %
HCT: 36.2 % (ref 35.0–45.0)
Hemoglobin: 12.5 g/dL (ref 11.7–15.5)
Lymphs Abs: 1701 cells/uL (ref 850–3900)
MCH: 29.4 pg (ref 27.0–33.0)
MCHC: 34.5 g/dL (ref 32.0–36.0)
MCV: 85.2 fL (ref 80.0–100.0)
MONOS PCT: 6.9 %
MPV: 10.8 fL (ref 7.5–12.5)
NEUTROS PCT: 59 %
Neutro Abs: 3186 cells/uL (ref 1500–7800)
Platelets: 260 10*3/uL (ref 140–400)
RBC: 4.25 10*6/uL (ref 3.80–5.10)
RDW: 13.1 % (ref 11.0–15.0)
TOTAL LYMPHOCYTE: 31.5 %
WBC: 5.4 10*3/uL (ref 3.8–10.8)
WBCMIX: 373 {cells}/uL (ref 200–950)

## 2017-02-28 LAB — PROTEIN ELECTROPHORESIS, SERUM, WITH REFLEX
Albumin ELP: 4.6 g/dL (ref 3.8–4.8)
Alpha 1: 0.3 g/dL (ref 0.2–0.3)
Alpha 2: 0.7 g/dL (ref 0.5–0.9)
Beta 2: 0.4 g/dL (ref 0.2–0.5)
Beta Globulin: 0.5 g/dL (ref 0.4–0.6)
Gamma Globulin: 1.2 g/dL (ref 0.8–1.7)
Total Protein: 7.6 g/dL (ref 6.1–8.1)

## 2017-02-28 LAB — IGG, IGA, IGM
IGM, SERUM: 96 mg/dL (ref 48–271)
IgG (Immunoglobin G), Serum: 1252 mg/dL (ref 694–1618)
Immunoglobulin A: 217 mg/dL (ref 81–463)

## 2017-02-28 LAB — QUANTIFERON-TB GOLD PLUS
NIL: 0.07 IU/mL
QuantiFERON-TB Gold Plus: NEGATIVE
TB1-NIL: 0.01 IU/mL
TB2-NIL: 0.01 IU/mL

## 2017-02-28 LAB — HEPATITIS C ANTIBODY
Hepatitis C Ab: NONREACTIVE
SIGNAL TO CUT-OFF: 0.02 (ref <1.00)

## 2017-02-28 LAB — GLUCOSE 6 PHOSPHATE DEHYDROGENASE: G-6PDH: 8.4 U/g{Hb} (ref 7.0–20.5)

## 2017-02-28 LAB — HEPATITIS B CORE ANTIBODY, IGM: HEP B C IGM: NONREACTIVE

## 2017-02-28 LAB — HEPATITIS B SURFACE ANTIGEN: Hepatitis B Surface Ag: NONREACTIVE

## 2017-03-03 ENCOUNTER — Other Ambulatory Visit: Payer: Self-pay

## 2017-03-03 ENCOUNTER — Ambulatory Visit: Payer: BLUE CROSS/BLUE SHIELD | Attending: Rheumatology | Admitting: Physical Therapy

## 2017-03-03 DIAGNOSIS — M6281 Muscle weakness (generalized): Secondary | ICD-10-CM

## 2017-03-03 DIAGNOSIS — M25562 Pain in left knee: Secondary | ICD-10-CM | POA: Insufficient documentation

## 2017-03-03 DIAGNOSIS — M25561 Pain in right knee: Secondary | ICD-10-CM | POA: Insufficient documentation

## 2017-03-03 DIAGNOSIS — R29898 Other symptoms and signs involving the musculoskeletal system: Secondary | ICD-10-CM | POA: Diagnosis present

## 2017-03-03 DIAGNOSIS — G8929 Other chronic pain: Secondary | ICD-10-CM | POA: Diagnosis present

## 2017-03-03 NOTE — Therapy (Signed)
[facility] [address]  [place], [place], [address] Phone: [phone]   Fax:  [phone]  Physical Therapy Evaluation  Patient Details  Name: ANJULIE DIPIERRO MRN: [id number] Date of Birth: March 29, 1970 Referring Provider: Dr. Bo Merino   Encounter Date: 03/03/2017  PT End of Session - 03/03/17 1500    Visit Number  1    Number of Visits  12    Date for PT Re-Evaluation  04/14/17    PT Start Time  1287    PT Stop Time  1508    PT Time Calculation (min)  34 min    Activity Tolerance  Patient tolerated treatment well    Behavior During Therapy  [facility] for tasks assessed/performed       Past Medical History:  Diagnosis Date  . Fibroids   . Gallstones   . GERD (gastroesophageal reflux disease)   . Headache(784.0)   . Low iron   . Pyelonephritis 09/02/2012  . Thyroid disease    hypothyroidism    Past Surgical History:  Procedure Laterality Date  . CESAREAN SECTION    . CHOLECYSTECTOMY N/A 12/06/2013   Procedure: LAPAROSCOPIC CHOLECYSTECTOMY WITH INTRAOPERATIVE CHOLANGIOGRAM;  Surgeon: Armandina Gemma, MD;  Location: [facility];  Service: General;  Laterality: N/A;    There were no vitals filed for this visit.   Subjective Assessment - 03/03/17 1435    Subjective  Patient reprot B knee pain - difficulty with bending and then straightening. Pain has been persistent for ~1 year. Has trouble performing floor to stand transfers. Stairs are also difficult. First few steps into transfer are always difficult.     Limitations  Standing    Diagnostic tests  xray - arthritis per patient     Patient Stated Goals  improve pain and mobility    Currently in Pain?  Yes    Pain Score  6     Pain Location  Knee    Pain Orientation  Right;Left    Pain Descriptors / Indicators  Aching;Sore    Pain Type  Chronic pain    Pain Onset  More than a month ago    Pain Frequency  Constant    Pain Relieving Factors  Voltaren gel          [facility] PT Assessment - 03/03/17 1438      Assessment   Medical Diagnosis  Pain in both knees, primary osteoarthritis of B knees    Referring Provider  Dr. Bo Merino    Onset Date/Surgical Date  -- ~1 year ago    Next MD Visit  prn    Prior Therapy  no      Precautions   Precautions  None      Restrictions   Weight Bearing Restrictions  No      Balance Screen   Has the patient fallen in the past 6 months  No    Has the patient had a decrease in activity level because of a fear of falling?   No    Is the patient reluctant to leave their home because of a fear of falling?   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  Cognition   Overall Cognitive Status  Within Functional Limits for tasks assessed      Observation/Other Assessments   Focus on Therapeutic Outcomes (FOTO)   Knee: 55 (45% limited, predicted 36% limited)      Sensation   Light Touch  Appears Intact      Coordination   Gross Motor Movements are Fluid and Coordinated  Yes      Posture/Postural Control   Posture/Postural Control  Postural limitations    Postural Limitations  Rounded Shoulders;Forward head      ROM / Strength   AROM / PROM / Strength  AROM;Strength      AROM   Overall AROM   Within functional limits for tasks performed    Overall AROM Comments  B knees - some pain with end ranges of motion      Strength   Overall Strength Comments  patient reporting pain with all MMT    Strength Assessment Site  Hip;Knee    Right/Left Hip  Right;Left    Right Hip Flexion  4-/5    Left Hip Flexion  4-/5    Right/Left Knee  Right;Left    Right Knee Flexion  4-/5    Right Knee Extension  4-/5    Left Knee Flexion  4-/5    Left Knee Extension  4-/5      Flexibility   Soft Tissue Assessment /Muscle Length   yes    Hamstrings  B moderate tightness    Quadriceps  B moderate tightness      Palpation   Patella mobility  good mobility in all planes    Palpation comment  TTP along B medial/lateral joint lines; B quad musculature      Special Tests    Special Tests  Knee Special Tests    Knee Special tests   Patellofemoral Grind Test (Clarke's Sign)      Patellofemoral Grind test (Clark's Sign)   Findings  Postive    Side   Right;Left             Objective measurements completed on examination: See above findings.      [facility] Adult PT Treatment/Exercise - 03/03/17 1438      Exercises   Exercises  Knee/Hip      Knee/Hip Exercises: Stretches   Quad Stretch  Both;2 reps;30 seconds    Quad Stretch Limitations  prone with strap      Knee/Hip Exercises: Seated   Long Arc Quad  Both;10 reps    Long [employer] Limitations  with ball squeeze      Knee/Hip Exercises: Supine   Bridges with [employer]  Both;10 reps    Straight Leg Raise with External Rotation  Both;10 reps             PT Education - 03/03/17 1459    Education provided  Yes    Education Details  exam findings, POC, HEP    Person(s) Educated  Patient    Methods  Explanation;Demonstration;Handout    Comprehension  Verbalized understanding;Returned demonstration;Need further instruction          PT Long Term Goals - 03/03/17 1556      PT LONG TERM GOAL #1   Title  patient to be independent wiht advanced HEP    Status  New    Target Date  04/14/17      PT LONG TERM GOAL #2   Title  patient to report ability to perform criss-cross sitting with pain no  greater than 2/10    Status  New    Target Date  04/14/17      PT LONG TERM GOAL #3   Title  patient to demonstrate ability to reciprocally ambulate up/down steps with pain no greater than 2/10    Status  New    Target Date  04/14/17      PT LONG TERM GOAL #4   Title  patient to demonstrate B LE strength to >/= 4+/5    Status  New    Target Date   04/14/17      PT LONG TERM GOAL #5   Title  patient to report reduction in pain symtpoms by >/= 50% for greater then 2 weeks    Status  New    Target Date  04/14/17             Plan - 03/03/17 1554    Clinical Impression Statement  Ms. Lingenfelter is a 46 y/o female presenting to OPPT today regarding primary reports of B knee pain that interferes with daily activities. patient today with good AROM of B knees, however pain at end ranges of motion, reduced strength of B proximal hips and knees, as well as some reduced flexibility in B HS and quad muscle groups. Patient with positive Grinds test with signs and symptoms consistent with OA of B knees vs patellofemoral pain. Initiated HEP today for gentle stretchingand strenghtening with good carryover. patient to benefit fromPT to address pain and functional mobility limitations to allow for improved QOL.     Clinical Presentation  Stable    Clinical Decision Making  Low    Rehab Potential  Good    PT Frequency  2x / week    PT Duration  6 weeks    PT Treatment/Interventions  ADLs/Self Care Home Management;Cryotherapy;Electrical Stimulation;Iontophoresis 4mg /ml Dexamethasone;Moist Heat;Therapeutic exercise;Therapeutic activities;Functional mobility training;Stair training;Gait training;Balance training;Neuromuscular re-education;Patient/family education;Manual techniques;Vasopneumatic Device;Taping;Dry needling;Passive range of motion    Consulted and Agree with Plan of Care  Patient       Patient will benefit from skilled therapeutic intervention in order to improve the following deficits and impairments:  Pain, Decreased strength, Decreased mobility, Decreased activity tolerance  Visit Diagnosis: Chronic pain of right knee - Plan: PT plan of care cert/re-cert  Chronic pain of left knee - Plan: PT plan of care cert/re-cert  Muscle weakness (generalized) - Plan: PT plan of care cert/re-cert  Other symptoms and signs involving the  musculoskeletal system - Plan: PT plan of care cert/re-cert     Problem List Patient Active Problem List   Diagnosis Date Noted  . Cholecystitis with cholelithiasis 12/06/2013  . Cholelithiasis with cholecystitis 10/27/2013  . Infection due to ESBL-producing Escherichia coli 09/02/2012  . UTI (urinary tract infection) 09/02/2012  . ALLERGIC RHINITIS, SEASONAL 07/13/2007  . CHEST PAIN 12/23/2006  . HYPOTHYROIDISM 12/08/2006  . GESTATIONAL DIABETES 12/08/2006  . NECK PAIN, CHRONIC 12/08/2006     Lanney Gins, PT, DPT 03/03/17 3:59 PM   [facility] [address]  [place], [place], [address] Phone: [phone]   Fax:  [phone]  Name: CHARLYE SPARE MRN: [id number] Date of Birth: Apr 20, 1970

## 2017-03-03 NOTE — Patient Instructions (Signed)
Bridge   Lie back, legs bent. Inhale, pressing hips up. Keeping ribs in, lengthen lower back. Exhale, rolling down along spine from top. Repeat __10-15__ times. **Add in ball squeeze at kneesCSX Corporation   Straighten operated leg and try to hold it __5__ seconds. Use __0__ lbs on ankle. Repeat __10-15__ times. **Add in ball/towel/pillow squeeze**  Straight Leg Raise: With External Leg Rotation   Lie on back with right leg straight, opposite leg bent. Rotate straight leg out and lift __6-8__ inches. Repeat __15__ times per set.   KNEE: Quadriceps - Prone   Place strap around ankle. Bring ankle toward buttocks. Press hip into surface. Hold _30__ seconds. _3__ reps per set

## 2017-03-10 ENCOUNTER — Ambulatory Visit: Payer: BLUE CROSS/BLUE SHIELD

## 2017-03-10 DIAGNOSIS — R29898 Other symptoms and signs involving the musculoskeletal system: Secondary | ICD-10-CM

## 2017-03-10 DIAGNOSIS — M25561 Pain in right knee: Secondary | ICD-10-CM | POA: Diagnosis not present

## 2017-03-10 DIAGNOSIS — G8929 Other chronic pain: Secondary | ICD-10-CM

## 2017-03-10 DIAGNOSIS — M6281 Muscle weakness (generalized): Secondary | ICD-10-CM

## 2017-03-10 DIAGNOSIS — M25562 Pain in left knee: Secondary | ICD-10-CM

## 2017-03-10 NOTE — Patient Instructions (Signed)

## 2017-03-10 NOTE — Therapy (Addendum)
[facility] [address]  [place], [place], [address] Phone: [phone]   Fax:  [phone]  Physical Therapy Treatment  Patient Details  Name: Whitney Chambers MRN: [id number] Date of Birth: 21-Feb-1971 Referring Provider: Dr. Bo Merino   Encounter Date: 03/10/2017  PT End of Session - 03/10/17 1707    Visit Number  2    Number of Visits  12    Date for PT Re-Evaluation  04/14/17    PT Start Time  1701    PT Stop Time  1751    PT Time Calculation (min)  50 min    Activity Tolerance  Patient tolerated treatment well    Behavior During Therapy  [facility] for tasks assessed/performed       Past Medical History:  Diagnosis Date  . Fibroids   . Gallstones   . GERD (gastroesophageal reflux disease)   . Headache(784.0)   . Low iron   . Pyelonephritis 09/02/2012  . Thyroid disease    hypothyroidism    Past Surgical History:  Procedure Laterality Date  . CESAREAN SECTION    . CHOLECYSTECTOMY N/A 12/06/2013   Procedure: LAPAROSCOPIC CHOLECYSTECTOMY WITH INTRAOPERATIVE CHOLANGIOGRAM;  Surgeon: Armandina Gemma, MD;  Location: [facility];  Service: General;  Laterality: N/A;    There were no vitals filed for this visit.  Subjective Assessment - 03/10/17 1703    Subjective  Pt. doing well today.  Reports daily adherence to HEP.      Diagnostic tests  xray - arthritis per patient     Patient Stated Goals  improve pain and mobility    Currently in Pain?  Yes    Pain Score  7     Pain Location  Knee    Pain Orientation  Right;Left    Pain Type  Chronic pain    Pain Onset  More than a month ago    Pain Frequency  Constant                      [facility] Adult PT Treatment/Exercise - 03/10/17 1712      Self-Care   Self-Care  Other Self-Care Comments    Other Self-Care Comments   Instruction and demo of use of "roller stick" to B quads in seated position       Knee/Hip Exercises: Stretches   Engineer, civil (consulting)  Both;2 reps;30 seconds    Quad Stretch Limitations  bolster and prone with strap     Hip Flexor Stretch  Right;Left;60 seconds    Hip Flexor Stretch Limitations  in mod thomas pos with strap       Knee/Hip Exercises: Aerobic   Nustep  NuStep: lvl 4, 6 min       Knee/Hip Exercises: Standing   Terminal Knee Extension  Right;Left;15 reps    Theraband Level (Terminal Knee Extension)  Level 4 (Blue)      Knee/Hip Exercises: Seated   Long Arc Quad  Both;10 reps    Long Arc Quad Limitations  with ball squeeze    Hamstring Curl  Right;Left;15 reps    Hamstring Limitations  red TB       Knee/Hip Exercises: Supine   Bridges with [employer]  Both;15 reps    Straight Leg Raises  Both;15 reps    Straight Leg Raises Limitations  cues for quad set prior to each movement       Modalities   Modalities  Iontophoresis  Iontophoresis   Type of Iontophoresis  Dexamethasone      Manual Therapy   Manual Therapy  Soft tissue mobilization    Manual therapy comments  in mod thomas position     Soft tissue mobilization  B quad STM/strumming; tenderness throughout              PT Education - 03/10/17 1812    Education provided  Yes    Education Details  iontophoresis handout including precautions/contraindications, and proper wear time with pt. verbalizing understanding    Person(s) Educated  Patient    Methods  Explanation;Demonstration;Verbal cues;Handout    Comprehension  Verbalized understanding;Returned demonstration;Verbal cues required;Need further instruction          PT Long Term Goals - 03/10/17 1711      PT LONG TERM GOAL #1   Title  patient to be independent wiht advanced HEP    Status  On-going      PT LONG TERM GOAL #2   Title  patient to report ability to perform criss-cross sitting with pain no greater than 2/10    Status  On-going      PT LONG TERM GOAL #3   Title  patient to demonstrate ability to reciprocally ambulate up/down steps with pain no  greater than 2/10    Status  On-going      PT LONG TERM GOAL #4   Title  patient to demonstrate B LE strength to >/= 4+/5    Status  On-going      PT LONG TERM GOAL #5   Title  patient to report reduction in pain symtpoms by >/= 50% for greater then 2 weeks    Status  On-going            Plan - 03/10/17 1711    Clinical Impression Statement  Pt. doing well today reporting consistent adherence to HEP.  Tolerated all seated and standing LE strengthening activities well today with a focus on TKE/quad strengthening.  Pt. TTP throughout B quads thus strumming/STM performed for hopeful improvement in tissue quality.  Iontophoresis order signed by MD thus iontophoresis initiated today to B knees for hopeful pain relief.  Pt. verbalizing understanding of precautions/contraindications and understanding of proper wear time.  Will monitor response in upcoming visit.     PT Treatment/Interventions  ADLs/Self Care Home Management;Cryotherapy;Electrical Stimulation;Iontophoresis 68m/ml Dexamethasone;Moist Heat;Therapeutic exercise;Therapeutic activities;Functional mobility training;Stair training;Gait training;Balance training;Neuromuscular re-education;Patient/family education;Manual techniques;Vasopneumatic Device;Taping;Dry needling;Passive range of motion    Consulted and Agree with Plan of Care  Patient       Patient will benefit from skilled therapeutic intervention in order to improve the following deficits and impairments:  Pain, Decreased strength, Decreased mobility, Decreased activity tolerance  Visit Diagnosis: Chronic pain of right knee  Chronic pain of left knee  Muscle weakness (generalized)  Other symptoms and signs involving the musculoskeletal system     Problem List Patient Active Problem List   Diagnosis Date Noted  . Cholecystitis with cholelithiasis 12/06/2013  . Cholelithiasis with cholecystitis 10/27/2013  . Infection due to ESBL-producing Escherichia coli  09/02/2012  . UTI (urinary tract infection) 09/02/2012  . ALLERGIC RHINITIS, SEASONAL 07/13/2007  . CHEST PAIN 12/23/2006  . HYPOTHYROIDISM 12/08/2006  . GESTATIONAL DIABETES 12/08/2006  . NECK PAIN, CHRONIC 12/08/2006    MBess Harvest PTA 03/10/17 6:14 PM   [facility] [address] [place] [address]: [phone]  Fax:  [phone] Name:  Whitney Chambers MRN: [id number] Date of Birth: 06/21/1970  PHYSICAL THERAPY DISCHARGE SUMMARY  Visits from Start of Care: 2  Current functional level related to goals / functional outcomes:   Pt only returned for 1 visit due to insurance reasons, therefore unable to assess progress with PT. Refer to above clinical impression for status as of last visit on 03/10/17. >30 days since last visit, therefore will proceed with discharge from PT for this episode.   Remaining deficits:   As above.   Education / Equipment:   HEP  Plan: Patient agrees to discharge.  Patient goals were not met. Patient is being discharged due to financial reasons.  ?????      Percival Spanish, PT, MPT 04/21/17, 3:52 PM  [facility] [address]  [place], [place], [address] Phone: [phone]   Fax:  [phone]

## 2017-03-12 ENCOUNTER — Telehealth: Payer: Self-pay | Admitting: *Deleted

## 2017-03-12 NOTE — Telephone Encounter (Signed)
Patient advised of lab results and verbalized understanding.  

## 2017-03-12 NOTE — Telephone Encounter (Signed)
-----   Message from Bo Merino, MD sent at 02/25/2017  4:45 PM EST ----- Some of the labs are still pending. It is okay to call and Plaquenil 200 mg by mouth twice a day Monday through Friday total 30 day supply with 3 refills. She will need labs in 1 month then every 3 months to monitor for drug toxicity. If labs are sta ble we can check them every 5 months. She should also get a baseline eye exam.

## 2017-03-17 ENCOUNTER — Ambulatory Visit: Payer: BLUE CROSS/BLUE SHIELD | Admitting: Rheumatology

## 2017-03-17 ENCOUNTER — Ambulatory Visit: Payer: BLUE CROSS/BLUE SHIELD

## 2017-05-12 NOTE — Progress Notes (Deleted)
Office Visit Note  Patient: Whitney Chambers             Date of Birth: [birth year]           MRN: [id number]             PCP: Corine Shelter, PA-C Referring: Corine Shelter, PA-C Visit Date: 05/26/2017 Occupation: @GUAROCC @    Subjective:  No chief complaint on file.   History of Present Illness: Whitney Chambers is a 47 y.o. female ***   Activities of Daily Living:  Patient reports morning stiffness for *** {minute/hour:19697}.   Patient {ACTIONS;DENIES/REPORTS:21021675::"Denies"} nocturnal pain.  Difficulty dressing/grooming: {ACTIONS;DENIES/REPORTS:21021675::"Denies"} Difficulty climbing stairs: {ACTIONS;DENIES/REPORTS:21021675::"Denies"} Difficulty getting out of chair: {ACTIONS;DENIES/REPORTS:21021675::"Denies"} Difficulty using hands for taps, buttons, cutlery, and/or writing: {ACTIONS;DENIES/REPORTS:21021675::"Denies"}   No Rheumatology ROS completed.   PMFS History:  Patient Active Problem List   Diagnosis Date Noted  . Cholecystitis with cholelithiasis 12/06/2013  . Cholelithiasis with cholecystitis 10/27/2013  . Infection due to ESBL-producing Escherichia coli 09/02/2012  . UTI (urinary tract infection) 09/02/2012  . ALLERGIC RHINITIS, SEASONAL 07/13/2007  . CHEST PAIN 12/23/2006  . HYPOTHYROIDISM 12/08/2006  . GESTATIONAL DIABETES 12/08/2006  . NECK PAIN, CHRONIC 12/08/2006    Past Medical History:  Diagnosis Date  . Fibroids   . Gallstones   . GERD (gastroesophageal reflux disease)   . Headache(784.0)   . Low iron   . Pyelonephritis 09/02/2012  . Thyroid disease    hypothyroidism    Family History  Problem Relation Age of Onset  . Stroke Mother   . Heart attack Father   . Heart Problems Sister   . Hypertension Brother   . Healthy Daughter   . Healthy Son    Past Surgical History:  Procedure Laterality Date  . CESAREAN SECTION    . CHOLECYSTECTOMY N/A 12/06/2013   Procedure: LAPAROSCOPIC CHOLECYSTECTOMY WITH INTRAOPERATIVE CHOLANGIOGRAM;   Surgeon: Armandina Gemma, MD;  Location: [facility];  Service: General;  Laterality: N/A;   Social History   Social History Narrative  . Not on file     Objective: Vital Signs: There were no vitals taken for this visit.   Physical Exam   Musculoskeletal Exam: ***  CDAI Exam: No CDAI exam completed.    Investigation: No additional findings. CBC Latest Ref Rng & Units 02/24/2017 11/29/2013 09/03/2012  WBC 3.8 - 10.8 Thousand/uL 5.4 6.6 4.2  Hemoglobin 11.7 - 15.5 g/dL 12.5 11.7(L) 8.6(L)  Hematocrit 35.0 - 45.0 % 36.2 34.5(L) 25.4(L)  Platelets 140 - 400 Thousand/uL 260 259 159   CMP Latest Ref Rng & Units 02/24/2017 02/24/2017 09/03/2012  Glucose 65 - 99 mg/dL - 100(H) 99  BUN 7 - 25 mg/dL - 10 7  Creatinine 0.50 - 1.10 mg/dL - 0.59 0.52  Sodium 135 - 146 mmol/L - 139 138  Potassium 3.5 - 5.3 mmol/L - 4.5 4.4  Chloride 98 - 110 mmol/L - 105 107  CO2 20 - 32 mmol/L - 29 27  Calcium 8.6 - 10.2 mg/dL - 9.4 8.5  Total Protein 6.1 - 8.1 g/dL 7.6 7.5 -  Total Bilirubin 0.2 - 1.2 mg/dL - 0.5 -  Alkaline Phos 39 - 117 U/L - - -  AST 10 - 35 U/L - 18 -  ALT 6 - 29 U/L - 21 -    Imaging: No results found.  Speciality Comments: No specialty comments available.    Procedures:  No procedures performed Allergies: Patient has no known allergies.   Assessment / Plan:  Visit Diagnoses: No diagnosis found.    Orders: No orders of the defined types were placed in this encounter.  No orders of the defined types were placed in this encounter.   Face-to-face time spent with patient was *** minutes. 50% of time was spent in counseling and coordination of care.  Follow-Up Instructions: No Follow-up on file.   Earnestine Mealing, CMA  Note - This record has been created using Editor, commissioning.  Chart creation errors have been sought, but may not always  have been located. Such creation errors do not reflect on  the standard of medical care.

## 2017-05-26 ENCOUNTER — Ambulatory Visit: Payer: BLUE CROSS/BLUE SHIELD | Admitting: Rheumatology

## 2017-12-27 ENCOUNTER — Other Ambulatory Visit: Payer: Self-pay | Admitting: Rheumatology

## 2018-02-19 ENCOUNTER — Ambulatory Visit: Payer: BLUE CROSS/BLUE SHIELD | Admitting: Allergy

## 2018-02-19 ENCOUNTER — Telehealth: Payer: Self-pay | Admitting: Allergy and Immunology

## 2018-02-19 ENCOUNTER — Other Ambulatory Visit: Payer: Self-pay

## 2018-02-19 ENCOUNTER — Other Ambulatory Visit: Payer: Self-pay | Admitting: Allergy

## 2018-02-19 ENCOUNTER — Encounter: Payer: Self-pay | Admitting: Allergy

## 2018-02-19 VITALS — BP 116/70 | HR 70 | Temp 97.8°F | Resp 16 | Ht 62.8 in | Wt 163.1 lb

## 2018-02-19 DIAGNOSIS — T783XXD Angioneurotic edema, subsequent encounter: Secondary | ICD-10-CM | POA: Diagnosis not present

## 2018-02-19 DIAGNOSIS — H1013 Acute atopic conjunctivitis, bilateral: Secondary | ICD-10-CM

## 2018-02-19 MED ORDER — LEVOCETIRIZINE DIHYDROCHLORIDE 5 MG PO TABS: 5.0000 mg | ORAL_TABLET | Freq: Every day | ORAL | 5 refills | Status: AC

## 2018-02-19 MED ORDER — OLOPATADINE HCL 0.7 % OP SOLN: 1.0000 [drp] | Freq: Every day | OPHTHALMIC | 5 refills | Status: AC

## 2018-02-19 MED ORDER — OLOPATADINE HCL 0.1 % OP SOLN: 1.0000 [drp] | Freq: Two times a day (BID) | OPHTHALMIC | 5 refills | Status: AC | PRN

## 2018-02-19 NOTE — Telephone Encounter (Signed)
Patient is calling to say that insurance doesn't cover the eye drops that were sent to the pharmacy today - what can she do?? Please call

## 2018-02-19 NOTE — Progress Notes (Signed)
New Patient Note  RE: Whitney Chambers MRN: [id number] DOB: 1970-12-17 Date of Office Visit: 02/19/2018  Referring provider: Corine Shelter, PA-C Primary care provider: Corine Shelter, PA-C  Chief Complaint: eye swelling  History of present illness: Whitney Chambers is a 47 y.o. female presenting today for consultation for angioedema.    She states when she goes outside or if she touches something and then touch her face she has noted she can develop eye swelling.   She feels the air and sun bother her eyes.  She reports having dry eyes but also that she has tearing.  She reports eye swelling of both eyes 1-2 times a month.  Also reports red eyes. She states today her eyes are a bit puffy.  She denies itchy eyes.  She has been having episode eye swelling for past 5-6 months but states this has been happening for several years.  The swelling resolves in about 3-4 days.  She has used "chamomile" eye drop and also taking zyrtec.  Does not feel the zyrtec helps with the swelling.   She denies any preceding illnesses, no new medications, no new foods, no stings, no change in soaps/lotions/detergents/facial products.  She states she uses an eye pencil and eyeliner daily and has not change the type she uses.  She denies any hives/itch/rash with the swelling.  She also denies any respiratory, CV or GI symptoms with the swelling.   She denies a family history of swelling.    She denies any symptoms of allergic rhinitis and no history of asthma, eczema or food allergy.  Review of systems: Review of Systems  Constitutional: Negative for chills, fever and malaise/fatigue.  HENT: Negative for congestion, ear discharge, nosebleeds, sinus pain and sore throat.   Eyes: Positive for discharge and redness. Negative for blurred vision, double vision, photophobia and pain.  Respiratory: Negative for cough, shortness of breath and wheezing.   Cardiovascular: Negative for chest pain.  Gastrointestinal:  Negative for abdominal pain, constipation, diarrhea, heartburn, nausea and vomiting.  Musculoskeletal: Negative for joint pain.  Skin: Negative for itching and rash.  Neurological: Negative for headaches.    All other systems negative unless noted above in HPI  Past medical history: Past Medical History:  Diagnosis Date  . Fibroids   . Gallstones   . GERD (gastroesophageal reflux disease)   . Headache(784.0)   . Low iron   . Pyelonephritis 09/02/2012  . Thyroid disease    hypothyroidism    Past surgical history: Past Surgical History:  Procedure Laterality Date  . CESAREAN SECTION    . CHOLECYSTECTOMY N/A 12/06/2013   Procedure: LAPAROSCOPIC CHOLECYSTECTOMY WITH INTRAOPERATIVE CHOLANGIOGRAM;  Surgeon: Armandina Gemma, MD;  Location: [facility];  Service: General;  Laterality: N/A;    Family history:  Family History  Problem Relation Age of Onset  . Stroke Mother   . Heart attack Father   . Heart Problems Sister   . Hypertension Brother   . Healthy Daughter   . Healthy Son   . Allergic rhinitis Neg Hx   . Angioedema Neg Hx   . Asthma Neg Hx   . Eczema Neg Hx   . Immunodeficiency Neg Hx   . Urticaria Neg Hx     Social history: Lives in a home with carpeting in bedroom with gas heating and central cooling.  Dog in the home.  No concern for water damage, mildew or roaches in the home.  She is a Haematologist.  Denies smoking history.  Medication List: Allergies as of 02/19/2018   No Known Allergies     Medication List       Accurate as of February 19, 2018 11:37 AM. Always use your most recent med list.        acetaminophen 500 MG tablet Commonly known as:  TYLENOL Take 1,000 mg by mouth every 6 (six) hours as needed for mild pain or headache.   amLODipine 5 MG tablet Commonly known as:  NORVASC Take 1 tablet (5 mg total) by mouth daily.   cetirizine 10 MG tablet Commonly known as:  ZYRTEC Take 10 mg by mouth as needed for allergies.   diclofenac sodium 1 %  Gel Commonly known as:  VOLTAREN Apply 3 gm to 3 large joints up to 3 times a day.Dispense 3 tubes with 3 refills.   Estradiol-Norethindrone Acet 0.5-0.1 MG tablet as needed.   hydroxychloroquine 200 MG tablet Commonly known as:  PLAQUENIL 1 tablet by mouth twice a day Monday through Friday   levothyroxine 112 MCG tablet Commonly known as:  SYNTHROID, LEVOTHROID Take 112 mcg by mouth daily before breakfast.   Olopatadine HCl 0.7 % Soln Commonly known as:  PAZEO Place 1 drop into both eyes daily.   oxyCODONE-acetaminophen 5-325 MG tablet Commonly known as:  ROXICET Take 1-2 tablets by mouth every 4 (four) hours as needed for moderate pain.   RESTASIS 0.05 % ophthalmic emulsion Generic drug:  cycloSPORINE 2 (two) times daily.   simvastatin 10 MG tablet Commonly known as:  ZOCOR Take 10 mg by mouth daily.       Known medication allergies: No Known Allergies   Physical examination: Blood pressure 116/70, pulse 70, temperature 97.8 F (36.6 C), temperature source Oral, resp. rate 16, height 5' 2.8" (1.595 m), weight 163 lb 2.3 oz (74 kg), SpO2 98 %.  General: Alert, interactive, in no acute distress. HEENT: PERRLA with mild edema of upper lids b/l, TMs pearly gray, turbinates non-edematous without discharge, post-pharynx non erythematous. Neck: Supple without lymphadenopathy. Lungs: Clear to auscultation without wheezing, rhonchi or rales. {no increased work of breathing. CV: Normal S1, S2 without murmurs. Abdomen: Nondistended, nontender. Skin: Warm and dry, without lesions or rashes. Extremities:  No clubbing, cyanosis or edema. Neuro:   Grossly intact.  Diagnositics/Labs:  Allergy testing: unable to perform due to recent antihistamine use  Assessment and plan: Angioedema vs allergic conjunctivitis  - intermittent episodes of eye swelling, tearing and redness can be triggered by allergen (histamine driven) vs swelling driven by bradykinin (non-allergen).      -  will obtain the following labs: hereditary angioedema panel, tryptase level, alpha gal panel and environmental allergy panel   - zyrtec has not been effective for you so will have you trial either Allegra 180mg  or Xyzal 5mg  daily as needed.  This will determine if another antihistamine will work better than zyrtec   - will have you trial allergy based eyedrop, Pazeo 1 drop each eye daily as needed   - you can use benadryl as needed for breakthrough symptoms   - if your environmental allergy panel is negative then will have you return for a skin testing visit off antihistamine.     - if testing above is normal can consider perform patch testing to assess for contact dermatitis  Follow-up in 3-4 months or sooner if needed  I appreciate the opportunity to take part in Abriel's care. Please do not hesitate to contact me with questions.  Sincerely,   Prudy Feeler, MD  Allergy/Immunology [facility]

## 2018-02-19 NOTE — Patient Instructions (Addendum)
Eye swelling  - intermittent episodes of eye swelling, tearing and redness can be triggered by allergen (histamine driven) vs swelling driven by bradykinin (non-allergen).      - will obtain the following labs: hereditary angioedema panel, tryptase level, alpha gal panel and environmental allergy panel   - zyrtec has not been effective for you so will have you trial either Allegra 180mg  or Xyzal 5mg  daily as needed.  This will determine if another antihistamine will work better than zyrtec   - will have you trial allergy based eyedrop, Pazeo 1 drop each eye daily as needed   - you can use benadryl as needed for breakthrough symptoms   - if your environmental allergy panel is negative then will have you return for a skin testing visit off antihistamine.      - if testing above is normal can consider perform patch testing to assess for contact dermatitis  Follow-up in 3-4 months or sooner if needed

## 2018-02-19 NOTE — Telephone Encounter (Signed)
I have change the rx to olopatadine

## 2018-02-24 LAB — ALPHA-GAL PANEL
Alpha Gal IgE*: <0.1 kU/L (ref <0.10)
BEEF CLASS INTERPRETATION: 0
Class Interpretation: 0
Class Interpretation: 0
Lamb/Mutton (Ovis spp) IgE: <0.1 kU/L (ref <0.35)
Pork (Sus spp) IgE: <0.1 kU/L (ref <0.35)

## 2018-02-24 LAB — HAE INTERPRETATION:

## 2018-02-24 LAB — TRYPTASE: Tryptase: 3.2 ug/L (ref 2.2–13.2)

## 2018-02-24 LAB — ALLERGENS, ZONE 2
Alternaria Alternata IgE: <0.1 kU/L
Amer Sycamore IgE Qn: <0.1 kU/L
Aspergillus Fumigatus IgE: <0.1 kU/L
Bahia Grass IgE: <0.1 kU/L
Bermuda Grass IgE: <0.1 kU/L
Cat Dander IgE: 3.04 kU/L — AB
Cedar, Mountain IgE: <0.1 kU/L
Cladosporium Herbarum IgE: <0.1 kU/L
Cockroach, American IgE: <0.1 kU/L
Common Silver Birch IgE: 0.76 kU/L — AB
D Farinae IgE: <0.1 kU/L
D Pteronyssinus IgE: <0.1 kU/L
Dog Dander IgE: 0.16 kU/L — AB
Elm, American IgE: 0.46 kU/L — AB
Hickory, White IgE: 4.56 kU/L — AB
Johnson Grass IgE: <0.1 kU/L
Maple/Box Elder IgE: <0.1 kU/L
Mucor Racemosus IgE: <0.1 kU/L
Mugwort IgE Qn: <0.1 kU/L
Nettle IgE: <0.1 kU/L
Oak, White IgE: <0.1 kU/L
Penicillium Chrysogen IgE: <0.1 kU/L
Pigweed, Rough IgE: <0.1 kU/L
Ragweed, Short IgE: <0.1 kU/L
Sheep Sorrel IgE Qn: 0.2 kU/L — AB
Stemphylium Herbarum IgE: <0.1 kU/L
Sweet gum IgE RAST Ql: <0.1 kU/L
Timothy Grass IgE: <0.1 kU/L
White Mulberry IgE: <0.1 kU/L

## 2018-02-24 LAB — HEREDITARY ANGIOEDEMA
C1 ESTERASE INHIBITOR, SERUM: 24 mg/dL (ref 21–39)
Complement C4, Serum: 32 mg/dL (ref 14–44)

## 2018-02-24 LAB — C1 ESTERASE INHIBITOR, FUNC: C1 Est.Inhib.Funct.: 90 %mean normal

## 2018-06-22 ENCOUNTER — Other Ambulatory Visit: Payer: Self-pay | Admitting: Rheumatology

## 2018-06-24 ENCOUNTER — Telehealth: Payer: Self-pay | Admitting: Rheumatology

## 2018-06-24 NOTE — Telephone Encounter (Signed)
Patient left a voicemail requesting prescription refill for her "blood pressure medicine."

## 2018-06-25 ENCOUNTER — Telehealth: Payer: Self-pay | Admitting: Rheumatology

## 2018-06-25 NOTE — Telephone Encounter (Signed)
Attempted to contact the patient and unable to leave a message. Mailbox is full.

## 2018-06-25 NOTE — Telephone Encounter (Signed)
Opened in error

## 2018-06-25 NOTE — Telephone Encounter (Signed)
Patient returned call.Patient has not been seen since December 2018. Patient states she has not been in because she has not had any pain. Patient states she is experiencing headaches. Patient states her blood pressure is "something like 145-160/92".  Patient is requesting a refill on her blood pressure medication. Patient states she sees [facility] but is unable to see them at this time. Please advise.

## 2018-06-26 NOTE — Telephone Encounter (Signed)
Please find out information on [facility] tele-visit and give it to the patient.

## 2018-06-26 NOTE — Telephone Encounter (Signed)
Spoke with Caryl Pina at [facility] as that is patient PCP and the are only doing telemedicine visits. Patient is the one who will have to set up the visit because there are multiple thing they have to discuss with the patient. Attempted to contact the patient and unable to leave a message as mailbox is full. Number for [facility] is [phone].

## 2018-06-30 NOTE — Telephone Encounter (Signed)
Attempted to contact the patient and unable to leave a message as mailbox is full.

## 2018-07-04 ENCOUNTER — Other Ambulatory Visit: Payer: Self-pay | Admitting: Rheumatology

## 2019-05-31 ENCOUNTER — Ambulatory Visit: Payer: BLUE CROSS/BLUE SHIELD | Attending: Internal Medicine

## 2019-05-31 DIAGNOSIS — Z23 Encounter for immunization: Secondary | ICD-10-CM

## 2019-05-31 NOTE — Progress Notes (Signed)
   Covid-19 Vaccination Clinic  Name:  SHAQUANNA POPOVICH    MRN: [id number] DOB: [birth year]  05/31/2019  Ms. Urias was observed post Covid-19 immunization for 15 minutes without incident. She was provided with Vaccine Information Sheet and instruction to access the V-Safe system.   Ms. Bissett was instructed to call 911 with any severe reactions post vaccine: Marland Kitchen Difficulty breathing  . Swelling of face and throat  . A fast heartbeat  . A bad rash all over body  . Dizziness and weakness   Immunizations Administered    Name Date Dose VIS Date Route   Pfizer COVID-19 Vaccine 05/31/2019 11:16 AM 0.3 mL 02/19/2019 Intramuscular   Manufacturer: [place]   Lot: [id number]   [place]: [id number]

## 2019-06-23 ENCOUNTER — Ambulatory Visit: Payer: BLUE CROSS/BLUE SHIELD | Attending: Internal Medicine

## 2019-06-23 DIAGNOSIS — Z23 Encounter for immunization: Secondary | ICD-10-CM

## 2019-06-23 NOTE — Progress Notes (Signed)
   Covid-19 Vaccination Clinic  Name:  Whitney Chambers    MRN: [id number] DOB: 02-07-1971  06/23/2019  [name] was observed post Covid-19 immunization for 15 minutes without incident. She was provided with Vaccine Information Sheet and instruction to access the V-Safe system.   [name] was instructed to call 911 with any severe reactions post vaccine: Marland Kitchen Difficulty breathing  . Swelling of face and throat  . A fast heartbeat  . A bad rash all over body  . Dizziness and weakness   Immunizations Administered    Name Date Dose VIS Date Route   Pfizer COVID-19 Vaccine 06/23/2019 11:52 AM 0.3 mL 02/19/2019 Intramuscular   Manufacturer: [place]   Lot: [id number]   [place]: [id number]

## 2021-06-04 ENCOUNTER — Ambulatory Visit (INDEPENDENT_AMBULATORY_CARE_PROVIDER_SITE_OTHER): Payer: Self-pay | Admitting: Psychiatry

## 2021-06-04 ENCOUNTER — Other Ambulatory Visit: Payer: Self-pay

## 2021-06-04 DIAGNOSIS — F4323 Adjustment disorder with mixed anxiety and depressed mood: Secondary | ICD-10-CM

## 2021-06-04 DIAGNOSIS — Z91419 Personal history of unspecified adult abuse: Secondary | ICD-10-CM

## 2021-06-04 DIAGNOSIS — F5102 Adjustment insomnia: Secondary | ICD-10-CM
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
## 2021-07-23 ENCOUNTER — Ambulatory Visit (INDEPENDENT_AMBULATORY_CARE_PROVIDER_SITE_OTHER): Payer: Self-pay | Admitting: Psychiatry

## 2021-07-23 DIAGNOSIS — F5102 Adjustment insomnia: Secondary | ICD-10-CM

## 2021-07-23 DIAGNOSIS — Z91419 Personal history of unspecified adult abuse: Secondary | ICD-10-CM

## 2021-07-23 DIAGNOSIS — F4323 Adjustment disorder with mixed anxiety and depressed mood: Secondary | ICD-10-CM
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
## 2021-09-17 ENCOUNTER — Ambulatory Visit: Payer: Self-pay | Admitting: Psychiatry

## 2021-10-15 ENCOUNTER — Ambulatory Visit: Payer: Self-pay | Admitting: Psychiatry

## 2024-04-14 ENCOUNTER — Other Ambulatory Visit (HOSPITAL_BASED_OUTPATIENT_CLINIC_OR_DEPARTMENT_OTHER): Payer: Self-pay

## 2024-04-14 MED ORDER — WEGOVY 1.5 MG PO TABS
ORAL_TABLET | ORAL | 0 refills | Status: AC
  Filled 2024-04-14: qty 30, 30d supply, fill #0

## 2024-04-15 ENCOUNTER — Other Ambulatory Visit (HOSPITAL_BASED_OUTPATIENT_CLINIC_OR_DEPARTMENT_OTHER): Payer: Self-pay

## 2024-04-15 MED ORDER — LEVOTHYROXINE SODIUM 100 MCG PO TABS
100.0000 ug | ORAL_TABLET | Freq: Every day | ORAL | 3 refills | Status: AC
  Filled 2024-04-15: qty 90, 90d supply, fill #0

## 2024-04-15 MED ORDER — AMLODIPINE BESYLATE-VALSARTAN 5-160 MG PO TABS
1.0000 | ORAL_TABLET | Freq: Every day | ORAL | 3 refills | Status: AC
  Filled 2024-04-15: qty 90, 90d supply, fill #0

## 2024-04-15 MED ORDER — SIMVASTATIN 10 MG PO TABS
10.0000 mg | ORAL_TABLET | Freq: Every day | ORAL | 3 refills | Status: AC
  Filled 2024-04-15: qty 90, 90d supply, fill #0

## 2024-04-15 MED ORDER — AMLODIPINE BESYLATE-VALSARTAN 5-160 MG PO TABS: 1.0000 | ORAL_TABLET | Freq: Every day | ORAL | 0 refills | Status: AC
